# Patient Record
Sex: Male | Born: 1955 | Race: White | Hispanic: No | Smoking: Never smoker
Health system: Southern US, Community
[De-identification: ages and names within clinical notes are randomized; demographics above are authoritative.]

## PROBLEM LIST (undated history)

## (undated) DIAGNOSIS — F32A Depression, unspecified: Secondary | ICD-10-CM

## (undated) DIAGNOSIS — G839 Paralytic syndrome, unspecified: Secondary | ICD-10-CM

## (undated) DIAGNOSIS — F419 Anxiety disorder, unspecified: Secondary | ICD-10-CM

## (undated) DIAGNOSIS — M1990 Unspecified osteoarthritis, unspecified site: Secondary | ICD-10-CM

## (undated) DIAGNOSIS — K449 Diaphragmatic hernia without obstruction or gangrene: Secondary | ICD-10-CM

## (undated) DIAGNOSIS — I2699 Other pulmonary embolism without acute cor pulmonale: Secondary | ICD-10-CM

## (undated) DIAGNOSIS — K219 Gastro-esophageal reflux disease without esophagitis: Secondary | ICD-10-CM

## (undated) DIAGNOSIS — F329 Major depressive disorder, single episode, unspecified: Secondary | ICD-10-CM

## (undated) DIAGNOSIS — I82409 Acute embolism and thrombosis of unspecified deep veins of unspecified lower extremity: Secondary | ICD-10-CM

## (undated) DIAGNOSIS — IMO0002 Reserved for concepts with insufficient information to code with codable children: Secondary | ICD-10-CM

## (undated) DIAGNOSIS — G8254 Quadriplegia, C5-C7 incomplete: Secondary | ICD-10-CM

## (undated) DIAGNOSIS — N200 Calculus of kidney: Secondary | ICD-10-CM

## (undated) DIAGNOSIS — I499 Cardiac arrhythmia, unspecified: Secondary | ICD-10-CM

## (undated) DIAGNOSIS — I10 Essential (primary) hypertension: Secondary | ICD-10-CM

## (undated) HISTORY — PX: KNEE SURGERY: SHX244

## (undated) HISTORY — PX: PROSTATE SURGERY: SHX751

## (undated) HISTORY — PX: OTHER SURGICAL HISTORY: SHX169

---

## 1997-12-23 ENCOUNTER — Ambulatory Visit (HOSPITAL_COMMUNITY): Admission: RE | Admit: 1997-12-23 | Discharge: 1997-12-23 | Payer: Self-pay | Admitting: Urology

## 1998-11-26 ENCOUNTER — Encounter: Payer: Self-pay | Admitting: Urology

## 1998-11-26 ENCOUNTER — Encounter: Admission: RE | Admit: 1998-11-26 | Discharge: 1998-11-26 | Payer: Self-pay | Admitting: Urology

## 1999-01-27 ENCOUNTER — Encounter: Admission: RE | Admit: 1999-01-27 | Discharge: 1999-01-27 | Payer: Self-pay | Admitting: Urology

## 1999-01-27 ENCOUNTER — Encounter: Payer: Self-pay | Admitting: Urology

## 1999-02-19 ENCOUNTER — Encounter: Payer: Self-pay | Admitting: Urology

## 1999-02-19 ENCOUNTER — Encounter: Admission: RE | Admit: 1999-02-19 | Discharge: 1999-02-19 | Payer: Self-pay | Admitting: Urology

## 1999-04-01 ENCOUNTER — Encounter: Admission: RE | Admit: 1999-04-01 | Discharge: 1999-04-01 | Payer: Self-pay | Admitting: Urology

## 1999-04-01 ENCOUNTER — Encounter: Payer: Self-pay | Admitting: Urology

## 1999-04-20 ENCOUNTER — Ambulatory Visit (HOSPITAL_COMMUNITY): Admission: RE | Admit: 1999-04-20 | Discharge: 1999-04-20 | Payer: Self-pay | Admitting: Urology

## 1999-07-30 ENCOUNTER — Encounter: Admission: RE | Admit: 1999-07-30 | Discharge: 1999-07-30 | Payer: Self-pay | Admitting: Urology

## 1999-07-30 ENCOUNTER — Encounter: Payer: Self-pay | Admitting: Urology

## 1999-08-28 ENCOUNTER — Encounter: Admission: RE | Admit: 1999-08-28 | Discharge: 1999-08-28 | Payer: Self-pay | Admitting: Urology

## 1999-08-28 ENCOUNTER — Encounter: Payer: Self-pay | Admitting: Urology

## 1999-09-02 ENCOUNTER — Encounter: Payer: Self-pay | Admitting: Urology

## 1999-09-02 ENCOUNTER — Encounter: Admission: RE | Admit: 1999-09-02 | Discharge: 1999-09-02 | Payer: Self-pay | Admitting: Urology

## 2000-04-12 ENCOUNTER — Encounter: Payer: Self-pay | Admitting: Urology

## 2000-04-12 ENCOUNTER — Encounter: Admission: RE | Admit: 2000-04-12 | Discharge: 2000-04-12 | Payer: Self-pay | Admitting: Urology

## 2001-01-15 ENCOUNTER — Emergency Department (HOSPITAL_COMMUNITY): Admission: EM | Admit: 2001-01-15 | Discharge: 2001-01-15 | Payer: Self-pay | Admitting: Emergency Medicine

## 2001-01-15 ENCOUNTER — Encounter: Payer: Self-pay | Admitting: Emergency Medicine

## 2001-01-17 ENCOUNTER — Encounter: Admission: RE | Admit: 2001-01-17 | Discharge: 2001-01-17 | Payer: Self-pay | Admitting: Urology

## 2001-01-17 ENCOUNTER — Encounter: Payer: Self-pay | Admitting: Urology

## 2001-08-27 ENCOUNTER — Encounter: Payer: Self-pay | Admitting: Emergency Medicine

## 2001-08-27 ENCOUNTER — Emergency Department (HOSPITAL_COMMUNITY): Admission: EM | Admit: 2001-08-27 | Discharge: 2001-08-27 | Payer: Self-pay | Admitting: Emergency Medicine

## 2007-07-27 ENCOUNTER — Ambulatory Visit (HOSPITAL_BASED_OUTPATIENT_CLINIC_OR_DEPARTMENT_OTHER): Admission: RE | Admit: 2007-07-27 | Discharge: 2007-07-27 | Payer: Self-pay | Admitting: Orthopedic Surgery

## 2010-06-23 NOTE — Op Note (Signed)
NAMENORVEL, WENKER NO.:  [id number]   MEDICAL RECORD NO.:  [id number]          PATIENT TYPE:  AMB   LOCATION:  [facility]                         FACILITY:  [facility]   PHYSICIAN:  Deidre Ala, M.D.    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   POSTOPERATIVE DIAGNOSES:  1. Right knee thick medial and lateral plicae.  2. Degenerative lateral meniscus tear.  3. Tight lateral retinaculum.  4. 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   PROCEDURE:  1. Right knee operative arthroscopy with medial and lateral plica      excisions.  2. Partial lateral meniscectomy.  3. Lateral retinacular release.   SURGEON:  1. Charlesetta Shanks, M.D.   ASSISTANT:  Phineas Semen, P.A.   ANESTHESIA:  General with endotracheal.   CULTURES:  None.   DRAINS:  None.   ESTIMATED BLOOD LOSS:  Minimal.   TOURNIQUET TIME:  35 minutes.   PATHOLOGIC FINDINGS AND HISTORY:  Lucas Harper is a 55 year old man long  term patient of mine who has had increasing knee pain.  He was seen in  November of last year with probable patellofemoral pain, some quad  atrophy and we had given him some cortisone injection in his knees.  He  seemed to have more anterior type knee pain.  He was having trouble  bending his knee.  He got an MRI scan at the [place] which showed some  partial thickness cartilage loss involving the most posterior aspect of  the lateral femoral condyle with an area of cartilage loss measuring  approximately 7 mm.  He had minimal patellar tendinosis.  He in our  office had increasing difficulty flexing his knee past 120 degrees but  an antalgic gait, parapatellar discomfort.  He felt as if he wanted to  proceed with right knee arthroscopy.  He has shoulder impingement and  wants to have surgery  on that but wanted his knee taken care of first.  He had pain with ambulation with no erythema, palpation and mild  tenderness.  This was parapatellar.  At surgery today he had huge thick  medial and lateral plicae which I think were his most symptomatic site  and probably painful on knee flexion with patellar compression against  them.  They had underlying fibrous bands and there was some rubbing on  the medial femoral condyle secondary to them.  The trochlea looked good.  He had fissuring in the posterior patella with a tight lateral  retinaculum and lateral patellar tilt and track with a lateral release  carried out.  The ACL was intact.  The medial meniscus was intact.  I  did not see a defect in the far posterior portion of the lateral femoral  condyle but there was significant degenerative inner rim lateral  meniscus which we debrided to a stable rim and smoothed with the ablator  on one.  There was a  large lateral plica and synovitis in the gutter.   PROCEDURE IN DETAIL:  With adequate anesthesia obtained using LMA  technique, 1 gram Ancef given IV prophylaxis, the patient was placed in  the supine position.  The right lower extremity was prepped from the  malleoli to the leg holder in the standard fashion.  After standard  prepping and draping, Esmarch exsanguination was used.  The tourniquet  was let up to 50 mmHg.  Superior and lateral inflow portal was made.  The knee was insufflated with normal saline with arthroscopic pump.  Medial and lateral scope portals were then made and the joint was  thoroughly inspected.  I then shaved out the large plica back to the  sidewall and lysed the medial band.  I then cauterized and smoothed with  the ablator on one.  I then checked the medial meniscus with a probe,  smoothed the medial femoral condyle where the plica had rubbed and  checked the ACL.  Portals were reversed.  I then probed and looked at  the lateral meniscus, used basket and  shaver to saucerize the inner rim  and seal with the ablator on one, took out the lateral plica and  synovitis and then did an arthroscopic lateral retinacular release from  vastus lateralis to the joint line with good improved tilt and track and  balance pressurization resulting.  The knee was then irrigated through  the scope, 0.5% Marcaine with morphine was injected in and about the  portals.  The portals were left open.  A bulky sterile compressive  dressing was applied with lateral foam pad for tamponade and EZ wrap  placed.  The patient then having tolerated the procedure well was then  awakened and taken to the recovery room in satisfactory condition to be  discharged per outpatient routine and given Percocet for pain and told  to call the office for appointment for recheck tomorrow for dressing  change.           ______________________________  V. Charlesetta Shanks, M.D.     VEP/MEDQ  D:  07/27/2007  T:  07/27/2007  Job:  [id number]   cc:   Joana Reamer, Dr

## 2010-06-26 ENCOUNTER — Emergency Department (HOSPITAL_COMMUNITY): Payer: Medicare Other

## 2010-06-26 ENCOUNTER — Emergency Department (HOSPITAL_COMMUNITY)
Admission: EM | Admit: 2010-06-26 | Discharge: 2010-06-26 | Disposition: A | Discharge status: Home / Self Care | Payer: Medicare Other | Attending: Emergency Medicine | Admitting: Emergency Medicine

## 2010-06-26 DIAGNOSIS — S8010XA Contusion of unspecified lower leg, initial encounter: Secondary | ICD-10-CM | POA: Insufficient documentation

## 2010-06-26 DIAGNOSIS — Z87828 Personal history of other (healed) physical injury and trauma: Secondary | ICD-10-CM | POA: Insufficient documentation

## 2010-06-26 DIAGNOSIS — W010XXA Fall on same level from slipping, tripping and stumbling without subsequent striking against object, initial encounter: Secondary | ICD-10-CM | POA: Insufficient documentation

## 2010-06-26 DIAGNOSIS — G839 Paralytic syndrome, unspecified: Secondary | ICD-10-CM | POA: Insufficient documentation

## 2010-06-26 DIAGNOSIS — Y92009 Unspecified place in unspecified non-institutional (private) residence as the place of occurrence of the external cause: Secondary | ICD-10-CM | POA: Insufficient documentation

## 2010-06-26 DIAGNOSIS — I10 Essential (primary) hypertension: Secondary | ICD-10-CM | POA: Insufficient documentation

## 2010-11-05 LAB — POCT I-STAT 4, (NA,K, GLUC, HGB,HCT)
Glucose, Bld: 114 — ABNORMAL HIGH
HCT: 44
Hemoglobin: 15
Operator id: 114531
Potassium: 4
Sodium: 135

## 2010-11-24 ENCOUNTER — Emergency Department (HOSPITAL_COMMUNITY): Payer: Medicare Other

## 2010-11-24 ENCOUNTER — Encounter (HOSPITAL_COMMUNITY): Payer: Self-pay

## 2010-11-24 ENCOUNTER — Emergency Department (HOSPITAL_COMMUNITY)
Admission: EM | Admit: 2010-11-24 | Discharge: 2010-11-24 | Disposition: A | Discharge status: Home / Self Care | Payer: Medicare Other | Attending: Emergency Medicine | Admitting: Emergency Medicine

## 2010-11-24 DIAGNOSIS — R109 Unspecified abdominal pain: Secondary | ICD-10-CM | POA: Insufficient documentation

## 2010-11-24 DIAGNOSIS — I10 Essential (primary) hypertension: Secondary | ICD-10-CM | POA: Insufficient documentation

## 2010-11-24 DIAGNOSIS — Z79899 Other long term (current) drug therapy: Secondary | ICD-10-CM | POA: Insufficient documentation

## 2010-11-24 DIAGNOSIS — N201 Calculus of ureter: Secondary | ICD-10-CM | POA: Insufficient documentation

## 2010-11-24 DIAGNOSIS — Z87442 Personal history of urinary calculi: Secondary | ICD-10-CM | POA: Insufficient documentation

## 2010-11-24 DIAGNOSIS — K449 Diaphragmatic hernia without obstruction or gangrene: Secondary | ICD-10-CM | POA: Insufficient documentation

## 2010-11-24 DIAGNOSIS — G8320 Monoplegia of upper limb affecting unspecified side: Secondary | ICD-10-CM | POA: Insufficient documentation

## 2010-11-24 LAB — POCT I-STAT, CHEM 8
Chloride: 99 mEq/L (ref 96–112)
Creatinine, Ser: 1 mg/dL (ref 0.50–1.35)
Glucose, Bld: 132 mg/dL — ABNORMAL HIGH (ref 70–99)
HCT: 46 % (ref 39.0–52.0)
Potassium: 4.2 mEq/L (ref 3.5–5.1)

## 2010-11-24 LAB — URINALYSIS, ROUTINE W REFLEX MICROSCOPIC
Glucose, UA: NEGATIVE mg/dL
Leukocytes, UA: NEGATIVE
Nitrite: NEGATIVE
Protein, ur: NEGATIVE mg/dL
Specific Gravity, Urine: 1.023 (ref 1.005–1.030)
Urobilinogen, UA: 1 mg/dL (ref 0.0–1.0)
pH: 6 (ref 5.0–8.0)

## 2011-01-18 ENCOUNTER — Inpatient Hospital Stay (HOSPITAL_COMMUNITY)
Admission: EM | Admit: 2011-01-18 | Discharge: 2011-01-20 | DRG: 308 | Disposition: A | Discharge status: Home / Self Care | Payer: Non-veteran care | Source: Ambulatory Visit | Attending: Internal Medicine | Admitting: Internal Medicine

## 2011-01-18 ENCOUNTER — Emergency Department (HOSPITAL_COMMUNITY): Payer: Non-veteran care

## 2011-01-18 ENCOUNTER — Other Ambulatory Visit: Payer: Self-pay

## 2011-01-18 ENCOUNTER — Encounter (HOSPITAL_COMMUNITY): Payer: Self-pay | Admitting: Physical Medicine and Rehabilitation

## 2011-01-18 DIAGNOSIS — G8190 Hemiplegia, unspecified affecting unspecified side: Secondary | ICD-10-CM | POA: Diagnosis present

## 2011-01-18 DIAGNOSIS — I2699 Other pulmonary embolism without acute cor pulmonale: Secondary | ICD-10-CM

## 2011-01-18 DIAGNOSIS — Z86711 Personal history of pulmonary embolism: Secondary | ICD-10-CM | POA: Diagnosis present

## 2011-01-18 DIAGNOSIS — I4891 Unspecified atrial fibrillation: Principal | ICD-10-CM

## 2011-01-18 DIAGNOSIS — G8929 Other chronic pain: Secondary | ICD-10-CM | POA: Diagnosis present

## 2011-01-18 DIAGNOSIS — I10 Essential (primary) hypertension: Secondary | ICD-10-CM

## 2011-01-18 HISTORY — DX: Other pulmonary embolism without acute cor pulmonale: I26.99

## 2011-01-18 HISTORY — DX: Gastro-esophageal reflux disease without esophagitis: K21.9

## 2011-01-18 HISTORY — DX: Cardiac arrhythmia, unspecified: I49.9

## 2011-01-18 HISTORY — DX: Depression, unspecified: F32.A

## 2011-01-18 HISTORY — DX: Diaphragmatic hernia without obstruction or gangrene: K44.9

## 2011-01-18 HISTORY — DX: Anxiety disorder, unspecified: F41.9

## 2011-01-18 HISTORY — DX: Unspecified osteoarthritis, unspecified site: M19.90

## 2011-01-18 HISTORY — DX: Essential (primary) hypertension: I10

## 2011-01-18 HISTORY — DX: Major depressive disorder, single episode, unspecified: F32.9

## 2011-01-18 LAB — CBC
HCT: 39.3 % (ref 39.0–52.0)
Hemoglobin: 13.1 g/dL (ref 13.0–17.0)
MCH: 29.6 pg (ref 26.0–34.0)
MCHC: 33.3 g/dL (ref 30.0–36.0)
RBC: 4.43 MIL/uL (ref 4.22–5.81)

## 2011-01-18 LAB — CARDIAC PANEL(CRET KIN+CKTOT+MB+TROPI)
CK, MB: 6.3 ng/mL (ref 0.3–4.0)
CK, MB: 5.6 ng/mL — ABNORMAL HIGH (ref 0.3–4.0)
CK, MB: 5.4 ng/mL — ABNORMAL HIGH (ref 0.3–4.0)
Relative Index: 3.8 — ABNORMAL HIGH (ref 0.0–2.5)
Relative Index: 2.9 — ABNORMAL HIGH (ref 0.0–2.5)
Relative Index: 3.4 — ABNORMAL HIGH (ref 0.0–2.5)
Total CK: 194 U/L (ref 7–232)
Total CK: 165 U/L (ref 7–232)
Total CK: 153 U/L (ref 7–232)
Troponin I: <0.3 ng/mL (ref <0.30)

## 2011-01-18 LAB — DIFFERENTIAL
Basophils Relative: 0 % (ref 0–1)
Lymphs Abs: 1.9 10*3/uL (ref 0.7–4.0)
Monocytes Absolute: 0.5 10*3/uL (ref 0.1–1.0)
Monocytes Relative: 7 % (ref 3–12)
Neutro Abs: 4.5 10*3/uL (ref 1.7–7.7)

## 2011-01-18 LAB — POCT I-STAT, CHEM 8
Calcium, Ion: 1.1 mmol/L — ABNORMAL LOW (ref 1.12–1.32)
Chloride: 104 mEq/L (ref 96–112)
Creatinine, Ser: 0.8 mg/dL (ref 0.50–1.35)
Glucose, Bld: 108 mg/dL — ABNORMAL HIGH (ref 70–99)
HCT: 43 % (ref 39.0–52.0)

## 2011-01-18 MED ORDER — IOHEXOL 300 MG/ML  SOLN
100.0000 mL | Freq: Once | INTRAMUSCULAR | Status: AC | PRN
  Administered 2011-01-18: 100 mL via INTRAVENOUS

## 2011-01-18 MED ORDER — ENOXAPARIN SODIUM 100 MG/ML ~~LOC~~ SOLN
90.0000 mg | Freq: Two times a day (BID) | SUBCUTANEOUS | Status: DC
  Administered 2011-01-18 – 2011-01-19 (×4): 90 mg via SUBCUTANEOUS
  Filled 2011-01-18 (×7): qty 1

## 2011-01-18 MED ORDER — RIVAROXABAN 10 MG PO TABS: 10.0000 mg | ORAL_TABLET | Freq: Every day | ORAL | Status: DC

## 2011-01-18 MED ORDER — ONDANSETRON HCL 4 MG/2ML IJ SOLN: 4.0000 mg | Freq: Four times a day (QID) | INTRAMUSCULAR | Status: DC | PRN

## 2011-01-18 MED ORDER — WARFARIN VIDEO
Freq: Once | Status: AC
  Administered 2011-01-19: 23:00:00

## 2011-01-18 MED ORDER — BUPRENORPHINE HCL 2 MG SL SUBL
8.0000 mg | SUBLINGUAL_TABLET | Freq: Two times a day (BID) | SUBLINGUAL | Status: DC
  Administered 2011-01-18 – 2011-01-20 (×4): 8 mg via SUBLINGUAL
  Filled 2011-01-18 (×4): qty 4

## 2011-01-18 MED ORDER — SODIUM CHLORIDE 0.9 % IV SOLN
INTRAVENOUS | Status: DC
  Administered 2011-01-18 – 2011-01-19 (×4): via INTRAVENOUS

## 2011-01-18 MED ORDER — ACETAMINOPHEN 325 MG PO TABS: 650.0000 mg | ORAL_TABLET | Freq: Four times a day (QID) | ORAL | Status: DC | PRN

## 2011-01-18 MED ORDER — PANTOPRAZOLE SODIUM 40 MG PO TBEC
40.0000 mg | DELAYED_RELEASE_TABLET | Freq: Every day | ORAL | Status: DC
  Administered 2011-01-18 – 2011-01-20 (×3): 40 mg via ORAL
  Filled 2011-01-18 (×3): qty 1

## 2011-01-18 MED ORDER — VITAMIN D3 25 MCG (1000 UNIT) PO TABS
1000.0000 [IU] | ORAL_TABLET | Freq: Every day | ORAL | Status: DC
  Administered 2011-01-18 – 2011-01-20 (×3): 1000 [IU] via ORAL
  Filled 2011-01-18 (×4): qty 1

## 2011-01-18 MED ORDER — WARFARIN SODIUM 10 MG PO TABS
10.0000 mg | ORAL_TABLET | Freq: Once | ORAL | Status: AC
  Administered 2011-01-18: 10 mg via ORAL
  Filled 2011-01-18: qty 1

## 2011-01-18 MED ORDER — COUMADIN BOOK
Freq: Once | Status: AC
  Administered 2011-01-18: 18:00:00
  Filled 2011-01-18 (×2): qty 1

## 2011-01-18 MED ORDER — BUPRENORPHINE HCL-NALOXONE HCL 8-2 MG SL SUBL: 1.0000 | SUBLINGUAL_TABLET | Freq: Two times a day (BID) | SUBLINGUAL | Status: DC

## 2011-01-18 MED ORDER — ONDANSETRON HCL 4 MG PO TABS: 4.0000 mg | ORAL_TABLET | Freq: Four times a day (QID) | ORAL | Status: DC | PRN

## 2011-01-18 MED ORDER — METOPROLOL TARTRATE 1 MG/ML IV SOLN
INTRAVENOUS | Status: AC
  Filled 2011-01-18: qty 5

## 2011-01-18 MED ORDER — ACETAMINOPHEN 650 MG RE SUPP: 650.0000 mg | Freq: Four times a day (QID) | RECTAL | Status: DC | PRN
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
## 2011-01-18 NOTE — ED Notes (Signed)
Returned from [employer]

## 2011-01-18 NOTE — ED Notes (Signed)
Family at bedside. 
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
## 2011-01-18 NOTE — Progress Notes (Signed)
*  PRELIMINARY RESULTS* BLEV Duplex  has been performed.  No obvious evidence of deep vein thrombosis bilaterally. No evidence of Baker's Cyst bilaterally.    Mila Homer, RDMS 01/18/2011, 9:46 AM

## 2011-01-18 NOTE — Progress Notes (Signed)
Pt seen and examined, admitted by Dr. Toniann Fail today for acute PE. I have reviewed all imagings, labs and agree with current plan of care. - Will need to address lovenox teaching with his daughter, pt himself unable to do it. - F/U ECHO, dopplers US neg for DVT, cardiac enzymes.  RAI,RIPUDEEP 01/18/2011, 6:55 PM

## 2011-01-18 NOTE — Progress Notes (Signed)
Utilization Review Completed.  [name]  01/18/2011 
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
## 2011-01-18 NOTE — ED Notes (Signed)
Pt presents to department via [facility] for evaluation of chest pain. Per EMS pt was in afib on arrival at home. Pt states chest pain woke him from sleep. Pt also reports SOB. He is conscious alert and oriented x4. NSR upon arrival to ED. 20g LAC. 5mg  Lopressor per EMS. No signs of distress at the present.

## 2011-01-18 NOTE — ED Notes (Signed)
Danni Leabo (pt sister) would like to be contacted [phone] when the patient has been placed in a room

## 2011-01-19 LAB — BASIC METABOLIC PANEL
CO2: 27 mEq/L (ref 19–32)
Chloride: 103 mEq/L (ref 96–112)
Creatinine, Ser: 0.7 mg/dL (ref 0.50–1.35)
Glucose, Bld: 103 mg/dL — ABNORMAL HIGH (ref 70–99)
Sodium: 137 mEq/L (ref 135–145)

## 2011-01-19 LAB — CBC
HCT: 39.2 % (ref 39.0–52.0)
MCV: 89.9 fL (ref 78.0–100.0)
RBC: 4.36 MIL/uL (ref 4.22–5.81)
WBC: 8.3 10*3/uL (ref 4.0–10.5)

## 2011-01-19 LAB — CARDIAC PANEL(CRET KIN+CKTOT+MB+TROPI): Relative Index: 3.2 — ABNORMAL HIGH (ref 0.0–2.5)

## 2011-01-19 MED ORDER — WARFARIN SODIUM 2.5 MG PO TABS
12.5000 mg | ORAL_TABLET | Freq: Once | ORAL | Status: AC
  Administered 2011-01-19: 12.5 mg via ORAL
  Filled 2011-01-19: qty 1

## 2011-01-19 NOTE — Progress Notes (Signed)
  Echocardiogram 2D Echocardiogram has been performed.  Lucas Harper, Real Cons 01/19/2011, 11:43 AM

## 2011-01-19 NOTE — Progress Notes (Signed)
   CARE MANAGEMENT NOTE 01/19/2011  Patient:  ORA, MCNATT   Account Number:  [id number]  Date Initiated:  01/18/2011  Documentation initiated by:  Junius Creamer  Subjective/Objective Assessment:   adm w pul embolus     Action/Plan:   lives alone, pcp dr Bethena Roys   Anticipated DC Date:  01/20/2011   Anticipated DC Plan:  HOME/SELF CARE      DC Planning Services  CM consult      Choice offered to / List presented to:             Status of service:   Medicare Important Message given?   (If response is "NO", the following Medicare IM given date fields will be blank) Date Medicare IM given:   Date Additional Medicare IM given:    Discharge Disposition:    Per UR Regulation:  Reviewed for med. necessity/level of care/duration of stay  Comments:  12/11 have filled out prior approval form for [facility] supplemnt for lovenox and faxed in, also have call into dr Heide Guile burks at [employer] [facility] [employer] [phone] or her nse heather on red team to see if they can provide lovneox, await call back. debbie Jeorgia Helming rn,bsn 12/10 will get cm sec to ck to see if ins coverage for lovenox, debbie Kasyn Rolph rn,bsn [phone]
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
## 2011-01-19 NOTE — Progress Notes (Signed)
Lucas Harper  ZOX:[id number]  DOB: 25-Jun-1955  DOA: 01/18/2011  [facility]: [facility]  Subjective: No specific complaints, chest pain resolved  Objective: Weight change: -2.883 kg (-6 lb 5.7 oz)  Intake/Output Summary (Last 24 hours) at 01/19/11 1334 Last data filed at 01/19/11 1300  Gross per 24 hour  Intake 1678.75 ml  Output    400 ml  Net 1278.75 ml   Blood pressure 116/72, pulse 81, temperature 97.7 F (36.5 C), temperature source Oral, resp. rate 16, height 6' (1.829 m), weight 83.3 kg (183 lb 10.3 oz), SpO2 95.00%.  Physical Exam: General: Alert and awake, oriented x3, not in any acute distress. HEENT: anicteric sclera, pupils reactive to light and accommodation, EOMI CVS: S1-S2 clear, no murmur rubs or gallops Chest: clear to auscultation bilaterally, no wheezing, rales or rhonchi Abdomen: soft nontender, nondistended, normal bowel sounds, no organomegaly Extremities: no cyanosis, clubbing or edema noted bilaterally   Lab Results: Basic Metabolic Panel:  Lab 01/19/11 8119 01/18/11 0334  NA 137 141  K 3.9 3.9  CL 103 104  CO2 27 --  GLUCOSE 103* 108*  BUN 11 14  CREATININE 0.70 0.80  CALCIUM 9.1 --  MG -- --  PHOS -- --   CBC:  Lab 01/19/11 0537 01/18/11 0334 01/18/11 0320  WBC 8.3 -- 7.0  NEUTROABS -- -- 4.5  HGB 13.4 14.6 --  HCT 39.2 43.0 --  MCV 89.9 -- 88.7  PLT 213 -- 217   Cardiac Enzymes:  Lab 01/19/11 0537 01/18/11 2030 01/18/11 1852  CKTOTAL 131 153 165  CKMB 4.2* 5.4* 5.6*  CKMBINDEX -- -- --  TROPONINI <0.30 <0.30 <0.30    Studies/Results: Ct Angio Chest W/cm &/or Wo Cm  01/18/2011  *RADIOLOGY REPORT*  Clinical Data:  Chest pain and shortness of breath.  CT ANGIOGRAPHY CHEST WITH CONTRAST  IMPRESSION: Small focal peripheral emboli demonstrated and left lower lung pulmonary arteries.  Atelectasis in the posterior lungs, likely to be due to dependent changes.  Original Report Authenticated By: Marlon Pel, M.D.   Dg Chest  Port 1 View  01/18/2011  *RADIOLOGY REPORT*  Clinical Data: Chest pain  PORTABLE CHEST - 1 VIEW  Comparison: None.  Findings: Shallow inspiration. The heart size and pulmonary vascularity are normal. The lungs appear clear and expanded without focal air space disease or consolidation. No blunting of the costophrenic angles.  IMPRESSION: Shallow inspiration.  No evidence of active pulmonary disease.  Original Report Authenticated By: Marlon Pel, M.D.    Medications: Scheduled Meds:   . buprenorphine  8 mg Sublingual BID  . cholecalciferol  1,000 Units Oral Daily  . coumadin book   Does not apply Once  . enoxaparin (LOVENOX) injection  90 mg Subcutaneous Q12H  . pantoprazole  40 mg Oral Q1200  . warfarin  10 mg Oral ONCE-1800  . warfarin  12.5 mg Oral ONCE-1800  . warfarin   Does not apply Once  . DISCONTD: buprenorphine-naloxone  1 tablet Sublingual BID   Continuous Infusions:   . sodium chloride 75 mL/hr at 01/19/11 0505     Assessment/Plan: Principal Problem:  *Pulmonary embolism:  - Doppler ultrasounds bilaterally done no evidence of DVT -  Continue Lovenox and Coumadin, 2-D echo done results pending - Patient states he is unable to do Lovenox injections himself, will have to provide teaching to his daughter - Discussed with case manager to check with the [facility] for Lovenox authorization  Active Problems:  A-fib: Rate controlled on  Lovenox and Coumadin  HTN (hypertension): Stable  Disposition: Hopefully DC home tomorrow,  if Lovenox authorized and teaching done with patient's daughter.    LOS: 1 day   Eriona Kinchen 01/19/2011, 1:34 PM

## 2011-01-19 NOTE — Progress Notes (Signed)
Pt had req that i check w dr Salli Real at [facility] about lovenox. Heather dr Salli Real nse called back and req h&p, docum about pul emb so she can speak w md to see if they can assist w lovenox inject. [facility] Await their decision.

## 2011-01-20 ENCOUNTER — Encounter (HOSPITAL_COMMUNITY): Payer: Self-pay | Admitting: Physical Therapy

## 2011-01-20 LAB — CBC
HCT: 40.2 % (ref 39.0–52.0)
MCV: 89.5 fL (ref 78.0–100.0)
RBC: 4.49 MIL/uL (ref 4.22–5.81)
WBC: 9 10*3/uL (ref 4.0–10.5)

## 2011-01-20 MED ORDER — RIVAROXABAN 15 MG PO TABS: 15.0000 mg | ORAL_TABLET | Freq: Two times a day (BID) | ORAL | Status: DC

## 2011-01-20 MED ORDER — RIVAROXABAN 15 MG PO TABS
15.0000 mg | ORAL_TABLET | Freq: Two times a day (BID) | ORAL | Status: DC
  Administered 2011-01-20 (×2): 15 mg via ORAL
  Filled 2011-01-20 (×5): qty 1

## 2011-01-20 NOTE — Progress Notes (Signed)
INITIAL ADULT NUTRITION ASSESSMENT Date: 01/20/2011   Time: 2:14 PM  Reason for Assessment: Low Braden  ASSESSMENT: Male 55 y.o.  Dx: Pulmonary embolism  Hx:  Past Medical History  Diagnosis Date  . Hypertension   . Angina   . Shortness of breath   . Chronic kidney disease     STONES  . GERD (gastroesophageal reflux disease)   . Arthritis   . Anxiety   . Dysrhythmia   . Peripheral vascular disease   . Hiatal hernia   . Depression     Related Meds:     . buprenorphine  8 mg Sublingual BID  . cholecalciferol  1,000 Units Oral Daily  . pantoprazole  40 mg Oral Q1200  . rivaroxaban  15 mg Oral BID PC  . warfarin  12.5 mg Oral ONCE-1800  . warfarin   Does not apply Once  . DISCONTD: enoxaparin (LOVENOX) injection  90 mg Subcutaneous Q12H    Ht: 6' (182.9 cm)  Wt: 183 lb 10.3 oz (83.3 kg)  Ideal Wt: 81 kg % Ideal Wt: 97%  Usual Wt: --- % Usual Wt: ---  Body mass index is 24.91 kg/(m^2).  Food/Nutrition Related Hx: no nutrition problems per nutrition screen  Labs:  CMP     Component Value Date/Time   NA 137 01/19/2011 0537   K 3.9 01/19/2011 0537   CL 103 01/19/2011 0537   CO2 27 01/19/2011 0537   GLUCOSE 103* 01/19/2011 0537   BUN 11 01/19/2011 0537   CREATININE 0.70 01/19/2011 0537   CALCIUM 9.1 01/19/2011 0537   GFRNONAA >90 01/19/2011 0537   GFRAA >90 01/19/2011 0537    I/O last 3 completed shifts: In: 3403.8 [I.V.:3403.8] Out: 400 [Urine:400]     Diet Order: Heart Healthy  Supplements/Tube Feeding: N/A  IVF:    DISCONTD: sodium chloride Last Rate: 75 mL/hr at 01/19/11 1832    Estimated Nutritional Needs:   Kcal: 2,100-2,300 Protein: 105-115 gm Fluid: 2.1-2.3 L  RD spoke with pt re: nutrition hx -- states his appetite is "ok"; no reported weight loss recently; no PO intake documented flowsheet records; noted pt with Stage II pressure ulcer; pt at risk for further skin breakdown give low braden score -- pt declining addition of  supplementation at this time.  NUTRITION DIAGNOSIS: -Increased nutrient needs (NI-5.1).  Status: Ongoing  RELATED TO: wound healing  AS EVIDENCE BY: estimated nutrition needs  MONITORING/EVALUATION(Goals): Goal: Meet 90-100% of estimated nutrition needs to promote wound healing Monitor: PO intake, weight, labs, I/O's, wound care  EDUCATION NEEDS: -No education needs identified at this time  INTERVENTION:  No intervention at this time -- pt declined  RD to follow for nutrition care plan  Dietitian #: [phone]  DOCUMENTATION CODES Per approved criteria  -Not Applicable    Alger Memos 01/20/2011, 2:14 PM

## 2011-01-20 NOTE — Progress Notes (Signed)
   CARE MANAGEMENT NOTE 01/20/2011  Patient:  Lucas Harper, Lucas Harper   Account Number:  [id number]  Date Initiated:  01/18/2011  Documentation initiated by:  Junius Creamer  Subjective/Objective Assessment:   adm w pul embolus     Action/Plan:   lives alone, pcp dr Bethena Roys   Anticipated DC Date:  01/20/2011   Anticipated DC Plan:  HOME/SELF CARE      DC Planning Services  CM consult      [facility] Choice  HOME HEALTH   Choice offered to / List presented to:  C-1 Patient        HH arranged  HH-1 RN  HH-2 PT      HH agency  OTHER - SEE NOTE   Status of service:   Medicare Important Message given?   (If response is "NO", the following Medicare IM given date fields will be blank) Date Medicare IM given:   Date Additional Medicare IM given:    Discharge Disposition:  HOME W HOME HEALTH SERVICES  Per UR Regulation:  Reviewed for med. necessity/level of care/duration of stay  Comments:  12/12 SPOKE W PT, MD SWITCHING PT TO Lesia Hausen INSTEAD OF COUMADIN AND LOVENOX. gave pt 10day free med and pt states he will use [facility] for rest of med, spoke w heather nse w dr Salli Real at [place] and they do not have xaralto. heather will arrange [facility] thru [facility]. she will set up for pt. debbie Nomie Buchberger rn,bsn 12/11 have filled out prior approval form for [facility] supplemnt for lovenox and faxed in, also have call into dr Heide Guile burks at [facility] [phone] or her nse heather on red team to see if they can provide lovneox, await call back. debbie Envi Eagleson rn,bsn 12/10 will get cm sec to ck to see if ins coverage for lovenox, debbie Elowen Debruyn rn,bsn [phone]
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
## 2011-01-20 NOTE — Progress Notes (Signed)
Pt has xarelto card. He has prescription for 10days free and hard copy if has to buy addit from [employer] card. Faxed prescription for xarelto to tasha moose at [facility] and they are going to try and get and mail xarelto to pt. Heather dr Salli Real nse w [facility] to [facility] and [facility]. Also req that healther add home health occup there.
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
## 2011-01-21 NOTE — Progress Notes (Signed)
Late entry: Lucas Harper dr Salli Real office called and [facility]. Lucas asked cm to call [facility] and req pt to eval for ot at home so i called [facility] this am and req home pt eval for home o2 needs.

## 2011-03-08 ENCOUNTER — Other Ambulatory Visit: Payer: Self-pay | Admitting: Internal Medicine

## 2011-03-22 DIAGNOSIS — I2699 Other pulmonary embolism without acute cor pulmonale: Secondary | ICD-10-CM | POA: Diagnosis not present

## 2011-03-22 DIAGNOSIS — I10 Essential (primary) hypertension: Secondary | ICD-10-CM | POA: Diagnosis not present

## 2011-03-22 DIAGNOSIS — E291 Testicular hypofunction: Secondary | ICD-10-CM | POA: Diagnosis not present

## 2011-03-22 DIAGNOSIS — M25559 Pain in unspecified hip: Secondary | ICD-10-CM | POA: Diagnosis not present

## 2011-03-22 DIAGNOSIS — K219 Gastro-esophageal reflux disease without esophagitis: Secondary | ICD-10-CM | POA: Diagnosis not present

## 2011-04-10 ENCOUNTER — Other Ambulatory Visit: Payer: Self-pay | Admitting: Internal Medicine

## 2011-05-11 DIAGNOSIS — I4891 Unspecified atrial fibrillation: Secondary | ICD-10-CM | POA: Diagnosis not present

## 2011-05-11 DIAGNOSIS — I2699 Other pulmonary embolism without acute cor pulmonale: Secondary | ICD-10-CM | POA: Diagnosis not present

## 2011-06-29 DIAGNOSIS — E291 Testicular hypofunction: Secondary | ICD-10-CM | POA: Diagnosis not present

## 2011-06-29 DIAGNOSIS — M25559 Pain in unspecified hip: Secondary | ICD-10-CM | POA: Diagnosis not present

## 2011-06-29 DIAGNOSIS — K219 Gastro-esophageal reflux disease without esophagitis: Secondary | ICD-10-CM | POA: Diagnosis not present

## 2011-06-29 DIAGNOSIS — I10 Essential (primary) hypertension: Secondary | ICD-10-CM | POA: Diagnosis not present

## 2011-06-29 DIAGNOSIS — Z6825 Body mass index (BMI) 25.0-25.9, adult: Secondary | ICD-10-CM | POA: Diagnosis not present

## 2011-07-20 DIAGNOSIS — G959 Disease of spinal cord, unspecified: Secondary | ICD-10-CM | POA: Diagnosis not present

## 2011-07-20 DIAGNOSIS — I10 Essential (primary) hypertension: Secondary | ICD-10-CM | POA: Diagnosis not present

## 2011-07-20 DIAGNOSIS — N200 Calculus of kidney: Secondary | ICD-10-CM | POA: Diagnosis not present

## 2011-09-01 DIAGNOSIS — E7800 Pure hypercholesterolemia, unspecified: Secondary | ICD-10-CM | POA: Diagnosis not present

## 2011-09-01 DIAGNOSIS — K219 Gastro-esophageal reflux disease without esophagitis: Secondary | ICD-10-CM | POA: Diagnosis not present

## 2011-09-01 DIAGNOSIS — E291 Testicular hypofunction: Secondary | ICD-10-CM | POA: Diagnosis not present

## 2011-09-01 DIAGNOSIS — M25559 Pain in unspecified hip: Secondary | ICD-10-CM | POA: Diagnosis not present

## 2011-09-01 DIAGNOSIS — I10 Essential (primary) hypertension: Secondary | ICD-10-CM | POA: Diagnosis not present

## 2011-10-03 ENCOUNTER — Encounter (HOSPITAL_COMMUNITY): Payer: Self-pay

## 2011-10-03 ENCOUNTER — Emergency Department (HOSPITAL_COMMUNITY)
Admission: EM | Admit: 2011-10-03 | Discharge: 2011-10-04 | Disposition: A | Discharge status: Home / Self Care | Payer: Medicare Other | Attending: Emergency Medicine | Admitting: Emergency Medicine

## 2011-10-03 DIAGNOSIS — N189 Chronic kidney disease, unspecified: Secondary | ICD-10-CM | POA: Insufficient documentation

## 2011-10-03 DIAGNOSIS — M79609 Pain in unspecified limb: Secondary | ICD-10-CM | POA: Diagnosis not present

## 2011-10-03 DIAGNOSIS — M79606 Pain in leg, unspecified: Secondary | ICD-10-CM

## 2011-10-03 DIAGNOSIS — I129 Hypertensive chronic kidney disease with stage 1 through stage 4 chronic kidney disease, or unspecified chronic kidney disease: Secondary | ICD-10-CM | POA: Insufficient documentation

## 2011-10-03 DIAGNOSIS — M129 Arthropathy, unspecified: Secondary | ICD-10-CM | POA: Insufficient documentation

## 2011-10-03 DIAGNOSIS — R6889 Other general symptoms and signs: Secondary | ICD-10-CM | POA: Diagnosis not present

## 2011-10-03 DIAGNOSIS — K219 Gastro-esophageal reflux disease without esophagitis: Secondary | ICD-10-CM | POA: Insufficient documentation
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
## 2011-10-19 DIAGNOSIS — K449 Diaphragmatic hernia without obstruction or gangrene: Secondary | ICD-10-CM | POA: Diagnosis not present

## 2011-11-02 DIAGNOSIS — E291 Testicular hypofunction: Secondary | ICD-10-CM | POA: Diagnosis not present

## 2011-11-02 DIAGNOSIS — L0291 Cutaneous abscess, unspecified: Secondary | ICD-10-CM | POA: Diagnosis not present

## 2011-11-02 DIAGNOSIS — K219 Gastro-esophageal reflux disease without esophagitis: Secondary | ICD-10-CM | POA: Diagnosis not present

## 2011-11-02 DIAGNOSIS — L0390 Cellulitis, unspecified: Secondary | ICD-10-CM | POA: Diagnosis not present

## 2011-11-02 DIAGNOSIS — M25559 Pain in unspecified hip: Secondary | ICD-10-CM | POA: Diagnosis not present

## 2011-11-02 DIAGNOSIS — I10 Essential (primary) hypertension: Secondary | ICD-10-CM | POA: Diagnosis not present

## 2012-01-20 DIAGNOSIS — K219 Gastro-esophageal reflux disease without esophagitis: Secondary | ICD-10-CM | POA: Diagnosis not present

## 2012-01-20 DIAGNOSIS — E291 Testicular hypofunction: Secondary | ICD-10-CM | POA: Diagnosis not present

## 2012-01-20 DIAGNOSIS — Z6825 Body mass index (BMI) 25.0-25.9, adult: Secondary | ICD-10-CM | POA: Diagnosis not present

## 2012-01-20 DIAGNOSIS — I10 Essential (primary) hypertension: Secondary | ICD-10-CM | POA: Diagnosis not present

## 2012-01-20 DIAGNOSIS — M25559 Pain in unspecified hip: Secondary | ICD-10-CM | POA: Diagnosis not present

## 2012-04-04 DIAGNOSIS — Z6825 Body mass index (BMI) 25.0-25.9, adult: Secondary | ICD-10-CM | POA: Diagnosis not present

## 2012-04-04 DIAGNOSIS — I10 Essential (primary) hypertension: Secondary | ICD-10-CM | POA: Diagnosis not present

## 2012-04-04 DIAGNOSIS — K219 Gastro-esophageal reflux disease without esophagitis: Secondary | ICD-10-CM | POA: Diagnosis not present

## 2012-04-04 DIAGNOSIS — M25559 Pain in unspecified hip: Secondary | ICD-10-CM | POA: Diagnosis not present

## 2012-04-04 DIAGNOSIS — E291 Testicular hypofunction: Secondary | ICD-10-CM | POA: Diagnosis not present

## 2012-04-26 DIAGNOSIS — M5430 Sciatica, unspecified side: Secondary | ICD-10-CM | POA: Diagnosis not present

## 2012-04-26 DIAGNOSIS — G8190 Hemiplegia, unspecified affecting unspecified side: Secondary | ICD-10-CM | POA: Diagnosis not present

## 2012-04-26 DIAGNOSIS — I4891 Unspecified atrial fibrillation: Secondary | ICD-10-CM | POA: Diagnosis not present

## 2012-06-05 DIAGNOSIS — Z6825 Body mass index (BMI) 25.0-25.9, adult: Secondary | ICD-10-CM | POA: Diagnosis not present

## 2012-06-05 DIAGNOSIS — K219 Gastro-esophageal reflux disease without esophagitis: Secondary | ICD-10-CM | POA: Diagnosis not present

## 2012-06-05 DIAGNOSIS — M25559 Pain in unspecified hip: Secondary | ICD-10-CM | POA: Diagnosis not present

## 2012-06-05 DIAGNOSIS — I10 Essential (primary) hypertension: Secondary | ICD-10-CM | POA: Diagnosis not present

## 2012-06-05 DIAGNOSIS — E291 Testicular hypofunction: Secondary | ICD-10-CM | POA: Diagnosis not present

## 2012-08-07 DIAGNOSIS — M25559 Pain in unspecified hip: Secondary | ICD-10-CM | POA: Diagnosis not present

## 2012-08-07 DIAGNOSIS — B372 Candidiasis of skin and nail: Secondary | ICD-10-CM | POA: Diagnosis not present

## 2012-08-07 DIAGNOSIS — Z6825 Body mass index (BMI) 25.0-25.9, adult: Secondary | ICD-10-CM | POA: Diagnosis not present

## 2012-08-07 DIAGNOSIS — E291 Testicular hypofunction: Secondary | ICD-10-CM | POA: Diagnosis not present

## 2012-08-07 DIAGNOSIS — I10 Essential (primary) hypertension: Secondary | ICD-10-CM | POA: Diagnosis not present

## 2012-09-18 DIAGNOSIS — G959 Disease of spinal cord, unspecified: Secondary | ICD-10-CM | POA: Diagnosis not present

## 2012-09-18 DIAGNOSIS — N200 Calculus of kidney: Secondary | ICD-10-CM | POA: Diagnosis not present

## 2012-09-18 DIAGNOSIS — F4310 Post-traumatic stress disorder, unspecified: Secondary | ICD-10-CM | POA: Diagnosis not present

## 2012-09-18 DIAGNOSIS — I10 Essential (primary) hypertension: Secondary | ICD-10-CM | POA: Diagnosis not present

## 2012-10-04 DIAGNOSIS — M25559 Pain in unspecified hip: Secondary | ICD-10-CM | POA: Diagnosis not present

## 2012-10-04 DIAGNOSIS — E7800 Pure hypercholesterolemia, unspecified: Secondary | ICD-10-CM | POA: Diagnosis not present

## 2012-10-04 DIAGNOSIS — I10 Essential (primary) hypertension: Secondary | ICD-10-CM | POA: Diagnosis not present

## 2012-10-04 DIAGNOSIS — E291 Testicular hypofunction: Secondary | ICD-10-CM | POA: Diagnosis not present

## 2012-10-04 DIAGNOSIS — K219 Gastro-esophageal reflux disease without esophagitis: Secondary | ICD-10-CM | POA: Diagnosis not present

## 2012-11-30 DIAGNOSIS — R1310 Dysphagia, unspecified: Secondary | ICD-10-CM | POA: Diagnosis not present

## 2012-12-04 DIAGNOSIS — Z6825 Body mass index (BMI) 25.0-25.9, adult: Secondary | ICD-10-CM | POA: Diagnosis not present

## 2012-12-04 DIAGNOSIS — E291 Testicular hypofunction: Secondary | ICD-10-CM | POA: Diagnosis not present

## 2012-12-04 DIAGNOSIS — M25559 Pain in unspecified hip: Secondary | ICD-10-CM | POA: Diagnosis not present

## 2012-12-04 DIAGNOSIS — I10 Essential (primary) hypertension: Secondary | ICD-10-CM | POA: Diagnosis not present

## 2012-12-04 DIAGNOSIS — K219 Gastro-esophageal reflux disease without esophagitis: Secondary | ICD-10-CM | POA: Diagnosis not present

## 2012-12-20 DIAGNOSIS — I10 Essential (primary) hypertension: Secondary | ICD-10-CM | POA: Diagnosis not present

## 2012-12-20 DIAGNOSIS — Z801 Family history of malignant neoplasm of trachea, bronchus and lung: Secondary | ICD-10-CM | POA: Diagnosis not present

## 2012-12-20 DIAGNOSIS — Z862 Personal history of diseases of the blood and blood-forming organs and certain disorders involving the immune mechanism: Secondary | ICD-10-CM | POA: Diagnosis not present

## 2012-12-20 DIAGNOSIS — Z87442 Personal history of urinary calculi: Secondary | ICD-10-CM | POA: Diagnosis not present

## 2012-12-20 DIAGNOSIS — R1310 Dysphagia, unspecified: Secondary | ICD-10-CM | POA: Diagnosis not present

## 2012-12-20 DIAGNOSIS — K449 Diaphragmatic hernia without obstruction or gangrene: Secondary | ICD-10-CM | POA: Diagnosis not present

## 2012-12-20 DIAGNOSIS — K219 Gastro-esophageal reflux disease without esophagitis: Secondary | ICD-10-CM | POA: Diagnosis not present

## 2012-12-20 DIAGNOSIS — G8190 Hemiplegia, unspecified affecting unspecified side: Secondary | ICD-10-CM | POA: Diagnosis not present

## 2012-12-20 DIAGNOSIS — Z823 Family history of stroke: Secondary | ICD-10-CM | POA: Diagnosis not present

## 2013-01-10 DIAGNOSIS — D689 Coagulation defect, unspecified: Secondary | ICD-10-CM | POA: Diagnosis not present

## 2013-01-10 DIAGNOSIS — I4891 Unspecified atrial fibrillation: Secondary | ICD-10-CM | POA: Diagnosis not present

## 2013-01-17 DIAGNOSIS — I2699 Other pulmonary embolism without acute cor pulmonale: Secondary | ICD-10-CM | POA: Diagnosis not present

## 2013-01-17 DIAGNOSIS — I4891 Unspecified atrial fibrillation: Secondary | ICD-10-CM | POA: Diagnosis not present

## 2013-02-09 DIAGNOSIS — I10 Essential (primary) hypertension: Secondary | ICD-10-CM | POA: Diagnosis not present

## 2013-02-09 DIAGNOSIS — K219 Gastro-esophageal reflux disease without esophagitis: Secondary | ICD-10-CM | POA: Diagnosis not present

## 2013-02-09 DIAGNOSIS — M25559 Pain in unspecified hip: Secondary | ICD-10-CM | POA: Diagnosis not present

## 2013-02-09 DIAGNOSIS — E291 Testicular hypofunction: Secondary | ICD-10-CM | POA: Diagnosis not present

## 2013-02-09 DIAGNOSIS — E7800 Pure hypercholesterolemia, unspecified: Secondary | ICD-10-CM | POA: Diagnosis not present

## 2013-03-28 ENCOUNTER — Emergency Department (HOSPITAL_COMMUNITY): Payer: Non-veteran care

## 2013-03-28 ENCOUNTER — Inpatient Hospital Stay (HOSPITAL_COMMUNITY)
Admission: EM | Admit: 2013-03-28 | Discharge: 2013-04-04 | DRG: 481 | Disposition: A | Discharge status: Skilled Nursing Facility | Payer: Non-veteran care | Attending: Orthopedic Surgery | Admitting: Orthopedic Surgery

## 2013-03-28 ENCOUNTER — Encounter (HOSPITAL_COMMUNITY): Payer: Self-pay | Admitting: Emergency Medicine

## 2013-03-28 DIAGNOSIS — F411 Generalized anxiety disorder: Secondary | ICD-10-CM | POA: Diagnosis present

## 2013-03-28 DIAGNOSIS — I10 Essential (primary) hypertension: Secondary | ICD-10-CM | POA: Diagnosis not present

## 2013-03-28 DIAGNOSIS — Z9181 History of falling: Secondary | ICD-10-CM

## 2013-03-28 DIAGNOSIS — M810 Age-related osteoporosis without current pathological fracture: Secondary | ICD-10-CM | POA: Diagnosis present

## 2013-03-28 DIAGNOSIS — S72409A Unspecified fracture of lower end of unspecified femur, initial encounter for closed fracture: Principal | ICD-10-CM | POA: Diagnosis present

## 2013-03-28 DIAGNOSIS — F329 Major depressive disorder, single episode, unspecified: Secondary | ICD-10-CM | POA: Diagnosis present

## 2013-03-28 DIAGNOSIS — M79609 Pain in unspecified limb: Secondary | ICD-10-CM | POA: Diagnosis present

## 2013-03-28 DIAGNOSIS — G8191 Hemiplegia, unspecified affecting right dominant side: Secondary | ICD-10-CM

## 2013-03-28 DIAGNOSIS — M25569 Pain in unspecified knee: Secondary | ICD-10-CM | POA: Diagnosis not present

## 2013-03-28 DIAGNOSIS — R52 Pain, unspecified: Secondary | ICD-10-CM | POA: Diagnosis not present

## 2013-03-28 DIAGNOSIS — M25559 Pain in unspecified hip: Secondary | ICD-10-CM | POA: Diagnosis not present

## 2013-03-28 DIAGNOSIS — Z86711 Personal history of pulmonary embolism: Secondary | ICD-10-CM | POA: Diagnosis present

## 2013-03-28 DIAGNOSIS — IMO0002 Reserved for concepts with insufficient information to code with codable children: Secondary | ICD-10-CM | POA: Diagnosis present

## 2013-03-28 DIAGNOSIS — S298XXA Other specified injuries of thorax, initial encounter: Secondary | ICD-10-CM | POA: Diagnosis not present

## 2013-03-28 DIAGNOSIS — D62 Acute posthemorrhagic anemia: Secondary | ICD-10-CM | POA: Diagnosis not present

## 2013-03-28 DIAGNOSIS — Y92009 Unspecified place in unspecified non-institutional (private) residence as the place of occurrence of the external cause: Secondary | ICD-10-CM

## 2013-03-28 DIAGNOSIS — I2782 Chronic pulmonary embolism: Secondary | ICD-10-CM | POA: Diagnosis present

## 2013-03-28 DIAGNOSIS — K219 Gastro-esophageal reflux disease without esophagitis: Secondary | ICD-10-CM | POA: Diagnosis present

## 2013-03-28 DIAGNOSIS — S79919A Unspecified injury of unspecified hip, initial encounter: Secondary | ICD-10-CM | POA: Diagnosis not present

## 2013-03-28 DIAGNOSIS — R079 Chest pain, unspecified: Secondary | ICD-10-CM | POA: Diagnosis not present

## 2013-03-28 DIAGNOSIS — W010XXA Fall on same level from slipping, tripping and stumbling without subsequent striking against object, initial encounter: Secondary | ICD-10-CM | POA: Diagnosis present

## 2013-03-28 DIAGNOSIS — G839 Paralytic syndrome, unspecified: Secondary | ICD-10-CM

## 2013-03-28 DIAGNOSIS — Z823 Family history of stroke: Secondary | ICD-10-CM

## 2013-03-28 DIAGNOSIS — Z9119 Patient's noncompliance with other medical treatment and regimen: Secondary | ICD-10-CM

## 2013-03-28 DIAGNOSIS — F1920 Other psychoactive substance dependence, uncomplicated: Secondary | ICD-10-CM | POA: Diagnosis present

## 2013-03-28 DIAGNOSIS — R1310 Dysphagia, unspecified: Secondary | ICD-10-CM | POA: Diagnosis present

## 2013-03-28 DIAGNOSIS — S7291XA Unspecified fracture of right femur, initial encounter for closed fracture: Secondary | ICD-10-CM

## 2013-03-28 DIAGNOSIS — G8190 Hemiplegia, unspecified affecting unspecified side: Secondary | ICD-10-CM | POA: Diagnosis present

## 2013-03-28 DIAGNOSIS — F3289 Other specified depressive episodes: Secondary | ICD-10-CM | POA: Diagnosis present

## 2013-03-28 DIAGNOSIS — I959 Hypotension, unspecified: Secondary | ICD-10-CM | POA: Diagnosis present

## 2013-03-28 DIAGNOSIS — S7290XA Unspecified fracture of unspecified femur, initial encounter for closed fracture: Secondary | ICD-10-CM | POA: Diagnosis not present

## 2013-03-28 DIAGNOSIS — G8929 Other chronic pain: Secondary | ICD-10-CM

## 2013-03-28 DIAGNOSIS — W19XXXA Unspecified fall, initial encounter: Secondary | ICD-10-CM

## 2013-03-28 DIAGNOSIS — I498 Other specified cardiac arrhythmias: Secondary | ICD-10-CM | POA: Diagnosis present

## 2013-03-28 DIAGNOSIS — F449 Dissociative and conversion disorder, unspecified: Secondary | ICD-10-CM | POA: Diagnosis present

## 2013-03-28 DIAGNOSIS — R259 Unspecified abnormal involuntary movements: Secondary | ICD-10-CM | POA: Diagnosis present

## 2013-03-28 DIAGNOSIS — K449 Diaphragmatic hernia without obstruction or gangrene: Secondary | ICD-10-CM | POA: Diagnosis present

## 2013-03-28 DIAGNOSIS — Z7901 Long term (current) use of anticoagulants: Secondary | ICD-10-CM

## 2013-03-28 DIAGNOSIS — R339 Retention of urine, unspecified: Secondary | ICD-10-CM | POA: Diagnosis present

## 2013-03-28 DIAGNOSIS — Z91199 Patient's noncompliance with other medical treatment and regimen due to unspecified reason: Secondary | ICD-10-CM

## 2013-03-28 DIAGNOSIS — I4891 Unspecified atrial fibrillation: Secondary | ICD-10-CM | POA: Diagnosis present

## 2013-03-28 DIAGNOSIS — Z801 Family history of malignant neoplasm of trachea, bronchus and lung: Secondary | ICD-10-CM

## 2013-03-28 HISTORY — DX: Paralytic syndrome, unspecified: G83.9

## 2013-03-28 MED ORDER — HYDROMORPHONE HCL PF 1 MG/ML IJ SOLN
1.0000 mg | Freq: Once | INTRAMUSCULAR | Status: AC
  Administered 2013-03-29: 1 mg via INTRAVENOUS
  Filled 2013-03-28: qty 1
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
## 2013-03-28 NOTE — ED Notes (Signed)
Per ems_ the patient reports that he fell tonight after loosing his balance. Patient has balance issues r/t to paralaysis in the past. Patient had Fentanyl 100 mcg - en route

## 2013-03-28 NOTE — ED Notes (Signed)
Bed: [id number] Expected date:  Expected time:  Means of arrival:  Comments: EMS/58 yo male with fall-knee injury and pain

## 2013-03-29 ENCOUNTER — Inpatient Hospital Stay (HOSPITAL_COMMUNITY): Payer: Non-veteran care

## 2013-03-29 ENCOUNTER — Emergency Department (HOSPITAL_COMMUNITY): Payer: Non-veteran care

## 2013-03-29 ENCOUNTER — Encounter (HOSPITAL_COMMUNITY): Payer: Non-veteran care | Admitting: Anesthesiology

## 2013-03-29 ENCOUNTER — Inpatient Hospital Stay (HOSPITAL_COMMUNITY): Payer: Non-veteran care | Admitting: Anesthesiology

## 2013-03-29 ENCOUNTER — Encounter (HOSPITAL_COMMUNITY)
Admission: EM | Disposition: A | Discharge status: Skilled Nursing Facility | Payer: Self-pay | Source: Home / Self Care | Attending: Orthopedic Surgery

## 2013-03-29 ENCOUNTER — Encounter (HOSPITAL_COMMUNITY): Payer: Self-pay | Admitting: Emergency Medicine

## 2013-03-29 DIAGNOSIS — S79929A Unspecified injury of unspecified thigh, initial encounter: Secondary | ICD-10-CM | POA: Diagnosis not present

## 2013-03-29 DIAGNOSIS — S298XXA Other specified injuries of thorax, initial encounter: Secondary | ICD-10-CM | POA: Diagnosis not present

## 2013-03-29 DIAGNOSIS — S72309A Unspecified fracture of shaft of unspecified femur, initial encounter for closed fracture: Secondary | ICD-10-CM | POA: Diagnosis not present

## 2013-03-29 DIAGNOSIS — I959 Hypotension, unspecified: Secondary | ICD-10-CM | POA: Diagnosis not present

## 2013-03-29 DIAGNOSIS — IMO0002 Reserved for concepts with insufficient information to code with codable children: Secondary | ICD-10-CM

## 2013-03-29 DIAGNOSIS — S79919A Unspecified injury of unspecified hip, initial encounter: Secondary | ICD-10-CM | POA: Diagnosis not present

## 2013-03-29 DIAGNOSIS — G8929 Other chronic pain: Secondary | ICD-10-CM

## 2013-03-29 DIAGNOSIS — M25569 Pain in unspecified knee: Secondary | ICD-10-CM | POA: Diagnosis not present

## 2013-03-29 DIAGNOSIS — G8190 Hemiplegia, unspecified affecting unspecified side: Secondary | ICD-10-CM | POA: Diagnosis not present

## 2013-03-29 DIAGNOSIS — S72413A Displaced unspecified condyle fracture of lower end of unspecified femur, initial encounter for closed fracture: Secondary | ICD-10-CM | POA: Diagnosis not present

## 2013-03-29 DIAGNOSIS — G8191 Hemiplegia, unspecified affecting right dominant side: Secondary | ICD-10-CM

## 2013-03-29 DIAGNOSIS — R079 Chest pain, unspecified: Secondary | ICD-10-CM | POA: Diagnosis not present

## 2013-03-29 DIAGNOSIS — M25559 Pain in unspecified hip: Secondary | ICD-10-CM | POA: Diagnosis not present

## 2013-03-29 DIAGNOSIS — S72409A Unspecified fracture of lower end of unspecified femur, initial encounter for closed fracture: Principal | ICD-10-CM

## 2013-03-29 DIAGNOSIS — I2782 Chronic pulmonary embolism: Secondary | ICD-10-CM | POA: Diagnosis not present

## 2013-03-29 HISTORY — PX: FEMUR IM NAIL: SHX1597

## 2013-03-29 HISTORY — DX: Reserved for concepts with insufficient information to code with codable children: IMO0002

## 2013-03-29 LAB — CBC WITH DIFFERENTIAL/PLATELET
BASOS ABS: 0 10*3/uL (ref 0.0–0.1)
Basophils Relative: 0 % (ref 0–1)
EOS PCT: 0 % (ref 0–5)
Eosinophils Absolute: 0 10*3/uL (ref 0.0–0.7)
HEMATOCRIT: 39.9 % (ref 39.0–52.0)
Hemoglobin: 14.1 g/dL (ref 13.0–17.0)
LYMPHS ABS: 1.4 10*3/uL (ref 0.7–4.0)
LYMPHS PCT: 11 % — AB (ref 12–46)
MCH: 32 pg (ref 26.0–34.0)
MCHC: 35.3 g/dL (ref 30.0–36.0)
MCV: 90.5 fL (ref 78.0–100.0)
MONO ABS: 0.8 10*3/uL (ref 0.1–1.0)
Monocytes Relative: 7 % (ref 3–12)
NEUTROS ABS: 9.8 10*3/uL — AB (ref 1.7–7.7)
Neutrophils Relative %: 82 % — ABNORMAL HIGH (ref 43–77)
PLATELETS: 254 10*3/uL (ref 150–400)
RBC: 4.41 MIL/uL (ref 4.22–5.81)
RDW: 13 % (ref 11.5–15.5)
WBC: 12 10*3/uL — AB (ref 4.0–10.5)

## 2013-03-29 LAB — BASIC METABOLIC PANEL
BUN: 14 mg/dL (ref 6–23)
CALCIUM: 9.2 mg/dL (ref 8.4–10.5)
CHLORIDE: 100 meq/L (ref 96–112)
CO2: 25 meq/L (ref 19–32)
Creatinine, Ser: 0.76 mg/dL (ref 0.50–1.35)
GFR calc Af Amer: >90 mL/min (ref >90)
GFR calc non Af Amer: >90 mL/min (ref >90)
GLUCOSE: 107 mg/dL — AB (ref 70–99)
Potassium: 3.6 mEq/L — ABNORMAL LOW (ref 3.7–5.3)
SODIUM: 141 meq/L (ref 137–147)

## 2013-03-29 SURGERY — INSERTION, INTRAMEDULLARY ROD, FEMUR, RETROGRADE
Anesthesia: General | Site: Leg Upper | Laterality: Right

## 2013-03-29 MED ORDER — ONDANSETRON HCL 4 MG/2ML IJ SOLN
INTRAMUSCULAR | Status: DC | PRN
  Administered 2013-03-29: 4 mg via INTRAVENOUS

## 2013-03-29 MED ORDER — GLYCOPYRROLATE 0.2 MG/ML IJ SOLN
INTRAMUSCULAR | Status: AC
  Filled 2013-03-29: qty 2

## 2013-03-29 MED ORDER — HYDROMORPHONE HCL PF 1 MG/ML IJ SOLN
INTRAMUSCULAR | Status: DC | PRN
  Administered 2013-03-29: 1 mg via INTRAVENOUS
  Administered 2013-03-29 (×2): 0.5 mg via INTRAVENOUS
  Administered 2013-03-29 (×2): 1 mg via INTRAVENOUS

## 2013-03-29 MED ORDER — LACTATED RINGERS IV SOLN
INTRAVENOUS | Status: DC
  Administered 2013-03-29: 15:00:00 via INTRAVENOUS

## 2013-03-29 MED ORDER — ALBUMIN HUMAN 25 % IV SOLN: 50.0000 g | Freq: Once | INTRAVENOUS | Status: DC

## 2013-03-29 MED ORDER — GLYCOPYRROLATE 0.2 MG/ML IJ SOLN
INTRAMUSCULAR | Status: DC | PRN
  Administered 2013-03-29: 0.3 mg via INTRAVENOUS

## 2013-03-29 MED ORDER — LACTATED RINGERS IV SOLN
INTRAVENOUS | Status: DC | PRN
  Administered 2013-03-29 (×2): via INTRAVENOUS

## 2013-03-29 MED ORDER — ROCURONIUM BROMIDE 100 MG/10ML IV SOLN
INTRAVENOUS | Status: DC | PRN
  Administered 2013-03-29: 40 mg via INTRAVENOUS

## 2013-03-29 MED ORDER — PANTOPRAZOLE SODIUM 40 MG PO TBEC
40.0000 mg | DELAYED_RELEASE_TABLET | Freq: Every day | ORAL | Status: DC
  Administered 2013-03-30 – 2013-04-04 (×6): 40 mg via ORAL
  Filled 2013-03-29 (×6): qty 1

## 2013-03-29 MED ORDER — KETAMINE HCL 10 MG/ML IJ SOLN
INTRAMUSCULAR | Status: DC | PRN
  Administered 2013-03-29 (×5): 10 mg via INTRAVENOUS

## 2013-03-29 MED ORDER — HYDROMORPHONE HCL PF 1 MG/ML IJ SOLN
1.0000 mg | Freq: Once | INTRAMUSCULAR | Status: AC
  Administered 2013-03-29: 1 mg via INTRAVENOUS
  Filled 2013-03-29: qty 1

## 2013-03-29 MED ORDER — CEFAZOLIN SODIUM-DEXTROSE 2-3 GM-% IV SOLR
2.0000 g | Freq: Once | INTRAVENOUS | Status: AC
  Administered 2013-03-29: 2 g via INTRAVENOUS

## 2013-03-29 MED ORDER — FENTANYL CITRATE 0.05 MG/ML IJ SOLN
INTRAMUSCULAR | Status: AC
  Filled 2013-03-29: qty 5

## 2013-03-29 MED ORDER — OXYCODONE-ACETAMINOPHEN 5-325 MG PO TABS
2.0000 | ORAL_TABLET | ORAL | Status: DC | PRN
  Administered 2013-03-29: 2 via ORAL
  Filled 2013-03-29: qty 2

## 2013-03-29 MED ORDER — LIDOCAINE HCL (CARDIAC) 20 MG/ML IV SOLN
INTRAVENOUS | Status: DC | PRN
  Administered 2013-03-29: 60 mg via INTRAVENOUS

## 2013-03-29 MED ORDER — NEOSTIGMINE METHYLSULFATE 1 MG/ML IJ SOLN
INTRAMUSCULAR | Status: DC | PRN
  Administered 2013-03-29: 2 mg via INTRAVENOUS

## 2013-03-29 MED ORDER — HYDROMORPHONE HCL PF 2 MG/ML IJ SOLN
INTRAMUSCULAR | Status: AC
  Filled 2013-03-29: qty 1

## 2013-03-29 MED ORDER — FENTANYL CITRATE 0.05 MG/ML IJ SOLN
INTRAMUSCULAR | Status: DC | PRN
  Administered 2013-03-29 (×5): 50 ug via INTRAVENOUS

## 2013-03-29 MED ORDER — LIDOCAINE HCL (CARDIAC) 20 MG/ML IV SOLN
INTRAVENOUS | Status: AC
  Filled 2013-03-29: qty 5

## 2013-03-29 MED ORDER — ESMOLOL HCL 10 MG/ML IV SOLN
INTRAVENOUS | Status: DC | PRN
  Administered 2013-03-29: 20 mg via INTRAVENOUS

## 2013-03-29 MED ORDER — 0.9 % SODIUM CHLORIDE (POUR BTL) OPTIME
TOPICAL | Status: DC | PRN
  Administered 2013-03-29: 1000 mL

## 2013-03-29 MED ORDER — ISOPROPYL ALCOHOL 70 % SOLN
Status: AC
  Filled 2013-03-29: qty 480

## 2013-03-29 MED ORDER — MIDAZOLAM HCL 2 MG/2ML IJ SOLN
INTRAMUSCULAR | Status: AC
  Filled 2013-03-29: qty 2

## 2013-03-29 MED ORDER — PHENYLEPHRINE HCL 10 MG/ML IJ SOLN
INTRAMUSCULAR | Status: DC | PRN
  Administered 2013-03-29 (×3): 40 ug via INTRAVENOUS

## 2013-03-29 MED ORDER — ONDANSETRON HCL 4 MG/2ML IJ SOLN
INTRAMUSCULAR | Status: AC
  Filled 2013-03-29: qty 2

## 2013-03-29 MED ORDER — ROCURONIUM BROMIDE 100 MG/10ML IV SOLN
INTRAVENOUS | Status: AC
  Filled 2013-03-29: qty 1

## 2013-03-29 MED ORDER — PROPOFOL 10 MG/ML IV BOLUS
INTRAVENOUS | Status: AC
  Filled 2013-03-29: qty 20

## 2013-03-29 MED ORDER — CEFAZOLIN SODIUM-DEXTROSE 2-3 GM-% IV SOLR
INTRAVENOUS | Status: AC
  Filled 2013-03-29: qty 50

## 2013-03-29 MED ORDER — IRBESARTAN 300 MG PO TABS
300.0000 mg | ORAL_TABLET | Freq: Every day | ORAL | Status: DC
  Filled 2013-03-29 (×2): qty 1

## 2013-03-29 MED ORDER — PROPOFOL 10 MG/ML IV BOLUS
INTRAVENOUS | Status: DC | PRN
  Administered 2013-03-29: 140 mg via INTRAVENOUS
  Administered 2013-03-29: 20 mg via INTRAVENOUS

## 2013-03-29 MED ORDER — LORAZEPAM 2 MG/ML IJ SOLN
1.0000 mg | Freq: Once | INTRAMUSCULAR | Status: AC
  Administered 2013-03-29: 1 mg via INTRAVENOUS
  Filled 2013-03-29: qty 1

## 2013-03-29 MED ORDER — PHENYLEPHRINE 40 MCG/ML (10ML) SYRINGE FOR IV PUSH (FOR BLOOD PRESSURE SUPPORT)
PREFILLED_SYRINGE | INTRAVENOUS | Status: AC
  Filled 2013-03-29: qty 10

## 2013-03-29 MED ORDER — FENTANYL CITRATE 0.05 MG/ML IJ SOLN
50.0000 ug | INTRAMUSCULAR | Status: DC | PRN
  Administered 2013-03-29 – 2013-04-01 (×24): 50 ug via INTRAVENOUS
  Filled 2013-03-29 (×24): qty 2

## 2013-03-29 MED ORDER — HEPARIN SODIUM (PORCINE) 5000 UNIT/ML IJ SOLN
5000.0000 [IU] | Freq: Three times a day (TID) | INTRAMUSCULAR | Status: DC
Start: 2013-03-29 — End: 2013-03-30
  Filled 2013-03-29 (×3): qty 1

## 2013-03-29 MED ORDER — SODIUM CHLORIDE 0.9 % IJ SOLN: 3.0000 mL | Freq: Two times a day (BID) | INTRAMUSCULAR | Status: DC

## 2013-03-29 SURGICAL SUPPLY — 55 items
BAG SPEC THK2 15X12 ZIP CLS (MISCELLANEOUS) ×1
BAG ZIPLOCK 12X15 (MISCELLANEOUS) ×2 IMPLANT
BANDAGE ELASTIC 6 VELCRO ST LF (GAUZE/BANDAGES/DRESSINGS) ×2 IMPLANT
BIT DRILL CALIBRATED 4.3MMX365 (DRILL) ×1 IMPLANT
BIT DRILL CROWE PNT TWST 4.5MM (DRILL) ×1 IMPLANT
BNDG COHESIVE 6X5 TAN STRL LF (GAUZE/BANDAGES/DRESSINGS) ×2 IMPLANT
DRAPE C-ARM 42X120 X-RAY (DRAPES) ×2 IMPLANT
DRAPE C-ARMOR (DRAPES) ×2 IMPLANT
DRAPE ORTHO SPLIT 77X108 STRL (DRAPES) ×2
DRAPE STERI IOBAN 125X83 (DRAPES) ×2 IMPLANT
DRAPE SURG ORHT 6 SPLT 77X108 (DRAPES) ×2 IMPLANT
DRAPE TABLE BACK 44X90 PK DISP (DRAPES) ×2 IMPLANT
DRILL CALIBRATED 4.3MMX365 (DRILL) ×2
DRILL CROWE POINT TWIST 4.5MM (DRILL) ×2
DRSG MEPILEX BORDER 4X4 (GAUZE/BANDAGES/DRESSINGS) ×2 IMPLANT
DURAPREP 26ML APPLICATOR (WOUND CARE) ×2 IMPLANT
ELECT REM PT RETURN 9FT ADLT (ELECTROSURGICAL) ×2
ELECTRODE REM PT RTRN 9FT ADLT (ELECTROSURGICAL) ×1 IMPLANT
GAUZE XEROFORM 1X8 LF (GAUZE/BANDAGES/DRESSINGS) ×2 IMPLANT
GLOVE BIO SURGEON STRL SZ7.5 (GLOVE) ×2 IMPLANT
GLOVE BIO SURGEON STRL SZ8 (GLOVE) IMPLANT
GLOVE BIOGEL PI IND STRL 7.5 (GLOVE) ×1 IMPLANT
GLOVE BIOGEL PI IND STRL 8 (GLOVE) ×2 IMPLANT
GLOVE BIOGEL PI INDICATOR 7.5 (GLOVE) ×1
GLOVE BIOGEL PI INDICATOR 8 (GLOVE) ×2
GLOVE SURG ORTHO 8.0 STRL STRW (GLOVE) ×4 IMPLANT
GOWN STRL REUS W/TWL 2XL LVL3 (GOWN DISPOSABLE) ×4 IMPLANT
GOWN STRL REUS W/TWL LRG LVL3 (GOWN DISPOSABLE) IMPLANT
GOWN STRL REUS W/TWL XL LVL3 (GOWN DISPOSABLE) ×2 IMPLANT
GUIDEWIRE BEAD TIP (WIRE) ×2 IMPLANT
GUIDEWIRE LAGSCREW 3.2X460 (WIRE) ×2 IMPLANT
KIT BASIN OR (CUSTOM PROCEDURE TRAY) ×2 IMPLANT
MANIFOLD NEPTUNE II (INSTRUMENTS) ×2 IMPLANT
NAIL FEM RETRO 12X380 (Nail) ×2 IMPLANT
NS IRRIG 1000ML POUR BTL (IV SOLUTION) ×2 IMPLANT
PACK GENERAL/GYN (CUSTOM PROCEDURE TRAY) IMPLANT
PACK TOTAL JOINT (CUSTOM PROCEDURE TRAY) ×2 IMPLANT
PAD ABD 8X10 STRL (GAUZE/BANDAGES/DRESSINGS) ×2 IMPLANT
POSITIONER SURGICAL ARM (MISCELLANEOUS) ×2 IMPLANT
SCREW CORT TI DBL LEAD 5X30 (Screw) ×2 IMPLANT
SCREW CORT TI DBL LEAD 5X58 (Screw) ×2 IMPLANT
SCREW CORT TI DBL LEAD 5X60 (Screw) ×2 IMPLANT
SCREW CORT TI DBL LEAD 5X65 (Screw) ×2 IMPLANT
SCREW CORT TI DBL LEAD 5X80 (Screw) ×2 IMPLANT
SCREW CORT TI DBL LEAD 5X85 (Screw) ×2 IMPLANT
SPONGE GAUZE 4X4 12PLY (GAUZE/BANDAGES/DRESSINGS) IMPLANT
STAPLER VISISTAT 35W (STAPLE) ×2 IMPLANT
SUT ETHILON 3 0 PS 1 (SUTURE) ×2 IMPLANT
SUT VIC AB 0 CT1 27 (SUTURE) ×2
SUT VIC AB 0 CT1 27XBRD ANTBC (SUTURE) ×2 IMPLANT
SUT VIC AB 2-0 CT1 27 (SUTURE) ×1
SUT VIC AB 2-0 CT1 TAPERPNT 27 (SUTURE) ×1 IMPLANT
TOWEL OR 17X26 10 PK STRL BLUE (TOWEL DISPOSABLE) ×4 IMPLANT
TRAY FOLEY CATH 14FRSI W/METER (CATHETERS) IMPLANT
TRAY FOLEY CATH 16FRSI W/METER (SET/KITS/TRAYS/PACK) ×2 IMPLANT

## 2013-03-29 NOTE — ED Provider Notes (Signed)
6:00 AM = Received sign out from [employer]. If MRI shows fx admit to medicine. If MRI shows no fx admit to orthopedics.   Filed Vitals:   03/29/13 0931 03/29/13 1134 03/29/13 1343 03/29/13 1436  BP:  132/82  132/72  Pulse: 99 104 109 105  Temp:      TempSrc:      Resp: 18 17 25 26   SpO2: 97% 96% 95% 95%     Results for orders placed during the hospital encounter of 03/28/13  CBC WITH DIFFERENTIAL      Result Value Ref Range   WBC 12.0 (*) 4.0 - 10.5 K/uL   RBC 4.41  4.22 - 5.81 MIL/uL   Hemoglobin 14.1  13.0 - 17.0 g/dL   HCT 33.2  95.1 - 88.4 %   MCV 90.5  78.0 - 100.0 fL   MCH 32.0  26.0 - 34.0 pg   MCHC 35.3  30.0 - 36.0 g/dL   RDW 16.6  06.3 - 01.6 %   Platelets 254  150 - 400 K/uL   Neutrophils Relative % 82 (*) 43 - 77 %   Neutro Abs 9.8 (*) 1.7 - 7.7 K/uL   Lymphocytes Relative 11 (*) 12 - 46 %   Lymphs Abs 1.4  0.7 - 4.0 K/uL   Monocytes Relative 7  3 - 12 %   Monocytes Absolute 0.8  0.1 - 1.0 K/uL   Eosinophils Relative 0  0 - 5 %   Eosinophils Absolute 0.0  0.0 - 0.7 K/uL   Basophils Relative 0  0 - 1 %   Basophils Absolute 0.0  0.0 - 0.1 K/uL  BASIC METABOLIC PANEL      Result Value Ref Range   Sodium 141  137 - 147 mEq/L   Potassium 3.6 (*) 3.7 - 5.3 mEq/L   Chloride 100  96 - 112 mEq/L   CO2 25  19 - 32 mEq/L   Glucose, Bld 107 (*) 70 - 99 mg/dL   BUN 14  6 - 23 mg/dL   Creatinine, Ser 0.10  0.50 - 1.35 mg/dL   Calcium 9.2  8.4 - 93.2 mg/dL   GFR calc non Af Amer >90  >90 mL/min   GFR calc Af Amer >90  >90 mL/min        CT Hip Right Wo Contrast (Final result)  Result time: 03/29/13 08:59:40    Final result by Rad Results In Interface (03/29/13 08:59:40)    Narrative:   CLINICAL DATA: Right hip pain secondary to a fall.  EXAM: CT OF THE RIGHT HIP WITHOUT CONTRAST  TECHNIQUE: Multidetector CT imaging was performed according to the standard protocol. Multiplanar CT image reconstructions were also generated.  COMPARISON: Radiographs  dated 03/29/2013  FINDINGS: There is no fracture or dislocation or hip joint effusion. The visualized pelvic bones are intact. There is a subtle soft tissue contusion in the subcutaneous fat overlying the lateral aspect of the right greater trochanter.  IMPRESSION: No fractures or dislocations. Soft tissue contusion as described.   Electronically Signed By: Geanie Cooley M.D. On: 03/29/2013 08:59      MR ATTEMPTED STUDY-NO REPORT (In process)      Procedure changed from MR Hip Right Wo Contrast        DG Hip Complete Right (Final result)  Result time: 03/29/13 01:00:06    Final result by Rad Results In Interface (03/29/13 01:00:06)    Narrative:   CLINICAL DATA: Fall, right hip pain. Distal right  femur fracture.  EXAM: RIGHT HIP - COMPLETE 2+ VIEW  COMPARISON: DG KNEE1- 2 VIEWS*R* dated 03/28/2013; DG TIBIA/FIBULA*L* dated 06/26/2010  FINDINGS: Non standard views are provided due to patient inability and discomfort.  Hips are located. There is subtle cortical irregularity along the superior aspect of the right femoral neck. This may represent osteophytosis but cannot exclude of femoral neck fracture.  IMPRESSION: Nonstandard views. Difficult to exclude a subtle right femoral neck fracture. If clinical concern, recommend CT the pelvis in this difficult patient.   Electronically Signed By: Genevive Bi M.D. On: 03/29/2013 01:00      DG Ribs Unilateral W/Chest Left (Final result)  Result time: 03/29/13 01:02:42    Final result by Rad Results In Interface (03/29/13 01:02:42)    Narrative:   CLINICAL DATA: Fall, left anterior rib pain  EXAM: LEFT RIBS AND CHEST - 3+ VIEW  COMPARISON: None.  FINDINGS: Normal mediastinum and cardiac silhouette. Chronic central bronchitic markings. Normal pulmonary vasculature. No effusion, infiltrate, or pneumothorax.  Dedicated views of the left ribs demonstrate no displaced fracture.  IMPRESSION: No acute  findings. Chronic bronchitic markings centrally.  No evidence of left rib fracture.   Electronically Signed By: Genevive Bi M.D. On: 03/29/2013 01:02      DG Knee 1-2 Views Right (Final result)  Result time: 03/28/13 22:56:01    Procedure changed from DG Knee Complete 4 Views Right       Final result by Rad Results In Interface (03/28/13 22:56:01)    Narrative:   CLINICAL DATA: Fall, right knee pain.  EXAM: RIGHT KNEE - 1-2 VIEW  COMPARISON: There is a fracture through the  FINDINGS: Study is limited by the patient's inability to straighten leg. There is a fracture noted on the lateral view only in the region of the distal femoral metaphysis. I see no joint effusion within the right knee, so this may be extra-articular. No subluxation or dislocation.  IMPRESSION: Distal femoral metaphyseal fracture seen only on the lateral view. Limited study due to the patient's inability to straighten leg. No joint effusion.   Electronically Signed By: Charlett Nose M.D. On: 03/28/2013 22:56         Rechecks  6:37 AM = Patient going for MRI. 1 mg Ativan ordered for anxiety.  7:33 AM = Patient unable to get MRI due to patient's contraction. Ordering CT hip.  8:00 AM = Patient sleeping. Tired after Ativan per ex-wife. Patient uses a wheelchair 90% of the time and uses a walker for short distances.  11:15 AM = Pain returning. Ordering 1 mg dilaudid.    Consults  8:45 AM = Spoke with Dr. August Saucer. Informed that we were unable to get MRI and CT is pending. Would like a call back with results.  9:25 AM = Spoke with Dr. August Saucer who was informed of negative CT results. Medical admission. Will consult. Likely will take to OR tonight. Will see patient this afternoon. Hold xarelto. Patient on xarelto for previous pulmonary embolism. Last dose Tuesday.  10:15 AM = Spoke with Dr. Mahala Menghini who is calling Dr. August Saucer. Will call back.  10:30 AM = Spoke with Dr. Mahala Menghini who is coming to see the  patient.  11:15 AM = Dr. Mahala Menghini called back. Patient will stay in the ED as there are no beds.     Patient will be taken to the OR for repair of a distal metaphyseal fracture with Dr. August Saucer. Patient admitted to the medicine service. Will stay in ED as there are no  beds currently. Patient in agreement with plan.  Final impressions: 1. Femoral distal fracture   2. Fall   3. Paralysis   4. Hx pulmonary embolism      Luiz IronJessica Katlin Solveig Fangman PA-C   This patient was discussed with Dr. Meryl DareWard        Shalin Linders K Ruthvik Barnaby, PA-C 03/29/13 1442

## 2013-03-29 NOTE — ED Notes (Signed)
Patient transported to X-ray 
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
## 2013-03-29 NOTE — Progress Notes (Signed)
UR completed 

## 2013-03-29 NOTE — ED Notes (Signed)
Dr Dean at bedside.

## 2013-03-29 NOTE — Preoperative (Signed)
Beta Blockers   Reason not to administer Beta Blockers:Not Applicable 

## 2013-03-29 NOTE — ED Provider Notes (Signed)
Medical screening examination/treatment/procedure(s) were performed by non-physician practitioner and as supervising physician I was immediately available for consultation/collaboration.  EKG Interpretation   None         Layla MawKristen N Ward, DO 03/29/13 1512
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
## 2013-03-29 NOTE — Progress Notes (Signed)
i will admit to my service post surgery

## 2013-03-29 NOTE — ED Provider Notes (Signed)
Medical screening examination/treatment/procedure(s) were conducted as a shared visit with non-physician practitioner(s) and myself.  I personally evaluated the patient during the encounter.   Patient with fall. Knee fracture.   Lucas RudeNathan R. Rubin PayorPickering, MD 03/29/13 [phone]

## 2013-03-29 NOTE — ED Notes (Signed)
Pt refused to let Ortho tech put splint on leg

## 2013-03-29 NOTE — Progress Notes (Signed)
Received verbal orders from Dr. August Saucerean for pt to go to OR for ORIF of R femur fracture. Pt's daughter signed consent form. Pt and pt's family with no further questions. Telemetry removed and pt transported to OR.

## 2013-03-29 NOTE — ED Notes (Signed)
Patient transported to MRI 

## 2013-03-29 NOTE — ED Notes (Signed)
Family at bedside.  Mouth swabs given to provide oral care and eyes wiped w/wet washcloth

## 2013-03-29 NOTE — ED Notes (Signed)
"  Triage" at [facility] called and will relay message to Dr. Diamantina Providenceean's assistant who will call back with the plan of care so it can be told to the family.

## 2013-03-29 NOTE — Progress Notes (Signed)
Called for report, nurse said she will call me back in a few mins.

## 2013-03-29 NOTE — ED Notes (Signed)
Patient transported to CT 

## 2013-03-30 ENCOUNTER — Encounter (HOSPITAL_COMMUNITY): Payer: Self-pay | Admitting: Orthopedic Surgery

## 2013-03-30 ENCOUNTER — Inpatient Hospital Stay (HOSPITAL_COMMUNITY): Payer: Non-veteran care

## 2013-03-30 DIAGNOSIS — M259 Joint disorder, unspecified: Secondary | ICD-10-CM | POA: Diagnosis not present

## 2013-03-30 DIAGNOSIS — S72409A Unspecified fracture of lower end of unspecified femur, initial encounter for closed fracture: Secondary | ICD-10-CM | POA: Diagnosis not present

## 2013-03-30 LAB — COMPREHENSIVE METABOLIC PANEL
ALBUMIN: 2.8 g/dL — AB (ref 3.5–5.2)
ALK PHOS: 56 U/L (ref 39–117)
ALT: 21 U/L (ref 0–53)
AST: 43 U/L — ABNORMAL HIGH (ref 0–37)
BUN: 8 mg/dL (ref 6–23)
CO2: 20 mEq/L (ref 19–32)
Calcium: 8.1 mg/dL — ABNORMAL LOW (ref 8.4–10.5)
Chloride: 97 mEq/L (ref 96–112)
Creatinine, Ser: 0.68 mg/dL (ref 0.50–1.35)
GFR calc non Af Amer: >90 mL/min (ref >90)
Glucose, Bld: 85 mg/dL (ref 70–99)
POTASSIUM: 3.7 meq/L (ref 3.7–5.3)
Sodium: 134 mEq/L — ABNORMAL LOW (ref 137–147)
TOTAL PROTEIN: 5.3 g/dL — AB (ref 6.0–8.3)
Total Bilirubin: 1.3 mg/dL — ABNORMAL HIGH (ref 0.3–1.2)

## 2013-03-30 LAB — CBC
HCT: 34.2 % — ABNORMAL LOW (ref 39.0–52.0)
Hemoglobin: 11.8 g/dL — ABNORMAL LOW (ref 13.0–17.0)
MCH: 31.5 pg (ref 26.0–34.0)
MCHC: 34.2 g/dL (ref 30.0–36.0)
MCV: 91.9 fL (ref 78.0–100.0)
Platelets: 190 10*3/uL (ref 150–400)
RBC: 3.72 MIL/uL — ABNORMAL LOW (ref 4.22–5.81)
RDW: 13.2 % (ref 11.5–15.5)
WBC: 10 10*3/uL (ref 4.0–10.5)

## 2013-03-30 LAB — PROTIME-INR
INR: 1.12 (ref 0.00–1.49)
PROTHROMBIN TIME: 14.2 s (ref 11.6–15.2)

## 2013-03-30 MED ORDER — DOCUSATE SODIUM 100 MG PO CAPS
100.0000 mg | ORAL_CAPSULE | Freq: Two times a day (BID) | ORAL | Status: DC
  Administered 2013-03-30 – 2013-03-31 (×2): 100 mg via ORAL
  Filled 2013-03-30 (×8): qty 1

## 2013-03-30 MED ORDER — METOCLOPRAMIDE HCL 5 MG PO TABS
5.0000 mg | ORAL_TABLET | Freq: Three times a day (TID) | ORAL | Status: DC | PRN
  Filled 2013-03-30: qty 2

## 2013-03-30 MED ORDER — ACETAMINOPHEN 650 MG RE SUPP: 650.0000 mg | Freq: Four times a day (QID) | RECTAL | Status: DC | PRN

## 2013-03-30 MED ORDER — METHOCARBAMOL 500 MG PO TABS: 500.0000 mg | ORAL_TABLET | Freq: Four times a day (QID) | ORAL | Status: DC | PRN

## 2013-03-30 MED ORDER — LACTATED RINGERS IV SOLN: INTRAVENOUS | Status: DC

## 2013-03-30 MED ORDER — CEFAZOLIN SODIUM-DEXTROSE 2-3 GM-% IV SOLR
2.0000 g | Freq: Three times a day (TID) | INTRAVENOUS | Status: AC
  Administered 2013-03-30 (×2): 2 g via INTRAVENOUS
  Filled 2013-03-30 (×2): qty 50

## 2013-03-30 MED ORDER — METOCLOPRAMIDE HCL 5 MG/ML IJ SOLN: 5.0000 mg | Freq: Three times a day (TID) | INTRAMUSCULAR | Status: DC | PRN

## 2013-03-30 MED ORDER — RIVAROXABAN 20 MG PO TABS
20.0000 mg | ORAL_TABLET | Freq: Every day | ORAL | Status: DC
  Administered 2013-03-30 – 2013-04-03 (×5): 20 mg via ORAL
  Filled 2013-03-30 (×6): qty 1

## 2013-03-30 MED ORDER — FENTANYL CITRATE 0.05 MG/ML IJ SOLN: 25.0000 ug | INTRAMUSCULAR | Status: DC | PRN

## 2013-03-30 MED ORDER — RIVAROXABAN 15 MG PO TABS
15.0000 mg | ORAL_TABLET | Freq: Every day | ORAL | Status: DC
  Filled 2013-03-30: qty 1

## 2013-03-30 MED ORDER — ACETAMINOPHEN 325 MG PO TABS: 650.0000 mg | ORAL_TABLET | Freq: Four times a day (QID) | ORAL | Status: DC | PRN

## 2013-03-30 MED ORDER — CARVEDILOL 3.125 MG PO TABS
3.1250 mg | ORAL_TABLET | Freq: Two times a day (BID) | ORAL | Status: DC
  Administered 2013-03-30 – 2013-04-04 (×8): 3.125 mg via ORAL
  Filled 2013-03-30 (×12): qty 1

## 2013-03-30 MED ORDER — MENTHOL 3 MG MT LOZG
1.0000 | LOZENGE | OROMUCOSAL | Status: DC | PRN
  Filled 2013-03-30: qty 9

## 2013-03-30 MED ORDER — OXYCODONE HCL 5 MG PO TABS: 5.0000 mg | ORAL_TABLET | ORAL | Status: DC | PRN

## 2013-03-30 MED ORDER — ONDANSETRON HCL 4 MG/2ML IJ SOLN: 4.0000 mg | Freq: Four times a day (QID) | INTRAMUSCULAR | Status: DC | PRN

## 2013-03-30 MED ORDER — PROMETHAZINE HCL 25 MG/ML IJ SOLN: 6.2500 mg | INTRAMUSCULAR | Status: DC | PRN

## 2013-03-30 MED ORDER — ONDANSETRON HCL 4 MG PO TABS: 4.0000 mg | ORAL_TABLET | Freq: Four times a day (QID) | ORAL | Status: DC | PRN

## 2013-03-30 MED ORDER — PHENOL 1.4 % MT LIQD
1.0000 | OROMUCOSAL | Status: DC | PRN
  Filled 2013-03-30: qty 177

## 2013-03-30 MED ORDER — POTASSIUM CHLORIDE IN NACL 20-0.9 MEQ/L-% IV SOLN
INTRAVENOUS | Status: AC
  Administered 2013-03-30 (×2): via INTRAVENOUS
  Administered 2013-03-31: 75 mL/h via INTRAVENOUS
  Administered 2013-03-31: 04:00:00 via INTRAVENOUS
  Filled 2013-03-30 (×4): qty 1000

## 2013-03-30 MED ORDER — DEXTROSE 5 % IV SOLN
500.0000 mg | Freq: Four times a day (QID) | INTRAVENOUS | Status: DC | PRN
  Filled 2013-03-30: qty 5

## 2013-03-30 MED ORDER — MEPERIDINE HCL 50 MG/ML IJ SOLN: 6.2500 mg | INTRAMUSCULAR | Status: DC | PRN

## 2013-03-30 MED ORDER — IRBESARTAN 150 MG PO TABS
150.0000 mg | ORAL_TABLET | Freq: Every day | ORAL | Status: DC
  Administered 2013-03-31: 150 mg via ORAL
  Filled 2013-03-30 (×3): qty 1

## 2013-03-30 NOTE — Progress Notes (Signed)
Subjective: Pt stable - pain seems controlled   Objective: Vital signs in last 24 hours: Temp:  [97.8 F (36.6 C)-99.3 F (37.4 C)] 98.2 F (36.8 C) (02/20 1332) Pulse Rate:  [98-120] 112 (02/20 1718) Resp:  [19-25] 20 (02/20 1600) BP: (91-128)/(50-81) 91/50 mmHg (02/20 1332) SpO2:  [91 %-100 %] 91 % (02/20 1600)  Intake/Output from previous day: 02/19 0701 - 02/20 0700 In: 1810 [I.V.:1810] Out: 1485 [Urine:1335; Blood:150] Intake/Output this shift: Total I/O In: -  Out: 400 [Urine:400]  Exam:  Intact pulses distally No cellulitis present Compartment soft  Labs:  Recent Labs  03/29/13 0159 03/30/13 0630  HGB 14.1 11.8*    Recent Labs  03/29/13 0159 03/30/13 0630  WBC 12.0* 10.0  RBC 4.41 3.72*  HCT 39.9 34.2*  PLT 254 190    Recent Labs  03/29/13 0159 03/30/13 0630  NA 141 134*  K 3.6* 3.7  CL 100 97  CO2 25 20  BUN 14 8  CREATININE 0.76 0.68  GLUCOSE 107* 85  CALCIUM 9.2 8.1*    Recent Labs  03/30/13 0630  INR 1.12    Assessment/Plan: Pt stable - pain seems ok on pain meds - on xarelto - continue   Nycole Kawahara SCOTT 03/30/2013, 5:46 PM
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
## 2013-03-30 NOTE — Transfer of Care (Signed)
Immediate Anesthesia Transfer of Care Note  Patient: Lucas Harper  Procedure(s) Performed: Procedure(s): INTRAMEDULLARY (IM) RETROGRADE FEMORAL NAILING (Right)  Patient Location: PACU  Anesthesia Type:General  Level of Consciousness: sedated, patient cooperative and responds to stimulation  Airway & Oxygen Therapy: Patient Spontanous Breathing and Patient connected to face mask oxygen  Post-op Assessment: Report given to PACU RN and Post -op Vital signs reviewed and stable  Post vital signs: Reviewed and stable  Complications: No apparent anesthesia complications
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
## 2013-03-30 NOTE — Progress Notes (Signed)
Clinical Social Work Department CLINICAL SOCIAL WORK PLACEMENT NOTE 03/30/2013  Patient:  Lucas Harper,Lucas Harper  Account Number:  [id number] Admit date:  03/28/2013  Clinical Social Worker:  Orpah GreekKELLY FOLEY, LCSWA  Date/time:  03/30/2013 02:06 PM  Clinical Social Work is seeking post-discharge placement for this patient at the following level of care:   SKILLED NURSING   (*CSW will update this form in Epic as items are completed)   03/30/2013  Patient/family provided with [facility] of Clinical Social Work's list of facilities offering this level of care within the geographic area requested by the patient (or if unable, by the patient's family).  03/30/2013  Patient/family informed of their freedom to choose among providers that offer the needed level of care, that participate in Medicare, Medicaid or managed care program needed by the patient, have an available bed and are willing to accept the patient.  03/30/2013  Patient/family informed of [facility]' ownership interest in [facility], as well as of the fact that they are under no obligation to receive care at this facility.  PASARR submitted to EDS on 03/30/2013 PASARR number received from EDS on 03/30/2013  FL2 transmitted to all facilities in geographic area requested by pt/family on  03/30/2013 FL2 transmitted to all facilities within larger geographic area on   Patient informed that his/her managed care company has contracts with or will negotiate with  certain facilities, including the following:     Patient/family informed of bed offers received:   Patient chooses bed at  Physician recommends and patient chooses bed at    Patient to be transferred to  on   Patient to be transferred to facility by   The following physician request were entered in Epic:   Additional Comments:   Lincoln MaxinKelly Raphaela Cannaday, LCSW [facility] Clinical Social Worker cell #: [phone]

## 2013-03-30 NOTE — Progress Notes (Signed)
Clinical Social Work Department BRIEF PSYCHOSOCIAL ASSESSMENT 03/30/2013  Patient:  [name],Jakyri H     Account Number:  [id number]     Admit date:  03/28/2013  Clinical Social Worker:  Orpah GreekFOLEY,Fatih Stalvey, LCSWA  Date/Time:  03/30/2013 01:57 PM  Referred by:  Physician  Date Referred:  03/30/2013 Referred for  SNF Placement   Other Referral:   Interview type:  Patient Other interview type:   and his daughter, Ava at bedside    PSYCHOSOCIAL DATA Living Status:  ALONE Admitted from facility:   Level of care:   Primary support name:  Oval LinseyDonna Mattox (sister) ph#: [phone] Primary support relationship to patient:  SIBLING Degree of support available:   good    CURRENT CONCERNS Current Concerns  Post-Acute Placement   Other Concerns:    SOCIAL WORK ASSESSMENT / PLAN CSW received consult for SNF placement as [facility].   Assessment/plan status:  [facility] Other assessment/ plan:   Information/referral to community resources:   CSW completed FL2 and [facility] - provided list of facilities to patient & daughter at bedside, will follow-up with bed offers when available.    PATIENT'S/FAMILY'S RESPONSE TO PLAN OF CARE: Patient's daughter states that they are hopeful to stay in a hospital setting such as [facility], but if that isn't possible they would be open to a SNF/Rehab facility. CSW will follow-up with bed offers when they're available.       Lincoln MaxinKelly Zerick Prevette, LCSW [facility] Clinical Social Worker cell #: [phone]

## 2013-03-30 NOTE — Progress Notes (Signed)
Rehab Admissions Coordinator Note:  Patient was screened by Clois DupesBoyette, Hagar Sadiq Godwin for appropriateness for an Inpatient Acute Rehab Consult.  At this time, we are recommending Inpatient Rehab consult. Please place order per PT and OT recommendations if pt wants to pursue rather than SNF placement.  Clois DupesBoyette, Venessa Wickham Godwin 03/30/2013, 11:14 AM  I can be reached at [phone].

## 2013-03-30 NOTE — Op Note (Signed)
[name]:  [name], [name]                 ACCOUNT NO.:  [id number]  MEDICAL RECORD NO.:  [id number]  LOCATION:  1443                         [facility]  PHYSICIAN:  Burnard BuntingG. Scott Chidi Shirer, M.D.    DATE OF BIRTH:  1955/10/13  DATE OF PROCEDURE: DATE OF DISCHARGE:                              OPERATIVE REPORT   PREOPERATIVE DIAGNOSIS:  Right distal femur fracture.  POSTOP:  Right distal femur fracture.  PROCEDURE:  Retrograde IM nail, right distal femur fracture.  SURGEONS:  Burnard BuntingG. Scott Charmain Diosdado, MD.  ASSISTANT:  None.  ANESTHESIA:  General endotracheal.  ESTIMATED BLOOD LOSS:  200 mL.  DRAINS:  None.  INDICATIONS:  Lucas Harper is a patient with right distal femur fracture, who presents for operative management after explanation of risks and benefits.  IMPLANT UTILIZED:  Biomet 12 x 38 nail, retrograde for distal interlock holes proximal interlock.  PROCEDURE IN DETAIL:  The patient was brought to the operating room, where general endotracheal anesthesia was induced.  Preoperative antibiotics were administered.  Time-out was called.  Right leg was prescrubbed with alcohol and Betadine, which was allowed to air dry, prepped with DuraPrep solution and draped in a sterile manner.  Collier Flowersoban was used to cover the operative field.  Under fluoroscopic guidance, incision was made through the patellar tendon.  Guide pin was placed in the most anterior portion of the intercondylar notch near Blumensaat's line.  Then under fluoroscopic guidance, a guide pin was advanced proximally.  Proximal reaming was performed with soft tissue protection in place.  The knee joint was thoroughly irrigated.  The femur was then reamed up to a 13.5 mm, a 12 mm nail was then placed and locked x4 distally under fluoroscopic guidance and x1 proximally under fluoroscopic guidance.  This done through a proximal incision.  Blunt dissection was taken down to the femoral shaft.  All interlocking screws were checked with  fluoroscopy.  At this time, thorough irrigation was performed on base incisions, which were then closed using 0 Vicryl suture, 2-0 Vicryl suture and skin staples.  The interlocking incisions were closed using a 2-0 Vicryl suture and staples.  Bulky dressing and a compressive wrap placed around the thigh.  The patient tolerated the procedure well without immediate complications. Transferred to recovery room in stable condition.     Burnard BuntingG. Scott Jonaven Hilgers, M.D.     GSD/MEDQ  D:  03/30/2013  T:  03/30/2013  Job:  [id number]
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
## 2013-03-30 NOTE — Brief Op Note (Signed)
03/28/2013 - 03/30/2013  12:12 AM  PATIENT:  Nicola PoliceEugene H Kugelman  58 y.o. male  PRE-OPERATIVE DIAGNOSIS:  right femerol fracture  POST-OPERATIVE DIAGNOSIS:  right femoral fracture  PROCEDURE:  Procedure(s): INTRAMEDULLARY (IM) RETROGRADE FEMORAL NAILING  SURGEON:  Surgeon(s): Cammy CopaGregory Scott Vassie Kugel, MD  ASSISTANT: none  ANESTHESIA:   general  EBL: 200 ml    Total I/O In: 1710 [I.V.:1710] Out: 750 [Urine:600; Blood:150]  BLOOD ADMINISTERED: none  DRAINS: none   LOCAL MEDICATIONS USED:  none  SPECIMEN:  No Specimen  COUNTS:  YES  TOURNIQUET:  * No tourniquets in log *  DICTATION: .Other Dictation: Dictation Number done  PLAN OF CARE: Admit to inpatient   PATIENT DISPOSITION:  PACU - hemodynamically stable

## 2013-03-31 LAB — BASIC METABOLIC PANEL
BUN: 11 mg/dL (ref 6–23)
CHLORIDE: 104 meq/L (ref 96–112)
CO2: 21 mEq/L (ref 19–32)
Calcium: 7.9 mg/dL — ABNORMAL LOW (ref 8.4–10.5)
Creatinine, Ser: 0.7 mg/dL (ref 0.50–1.35)
GFR calc Af Amer: >90 mL/min (ref >90)
GFR calc non Af Amer: >90 mL/min (ref >90)
Glucose, Bld: 127 mg/dL — ABNORMAL HIGH (ref 70–99)
POTASSIUM: 4 meq/L (ref 3.7–5.3)
Sodium: 139 mEq/L (ref 137–147)

## 2013-03-31 LAB — PROTIME-INR
INR: 1.89 — ABNORMAL HIGH (ref 0.00–1.49)
PROTHROMBIN TIME: 21.1 s — AB (ref 11.6–15.2)

## 2013-03-31 LAB — CBC
HCT: 28.7 % — ABNORMAL LOW (ref 39.0–52.0)
Hemoglobin: 9.6 g/dL — ABNORMAL LOW (ref 13.0–17.0)
MCH: 30.7 pg (ref 26.0–34.0)
MCHC: 33.4 g/dL (ref 30.0–36.0)
MCV: 91.7 fL (ref 78.0–100.0)
Platelets: 150 10*3/uL (ref 150–400)
RBC: 3.13 MIL/uL — ABNORMAL LOW (ref 4.22–5.81)
RDW: 13.2 % (ref 11.5–15.5)
WBC: 9 10*3/uL (ref 4.0–10.5)

## 2013-03-31 NOTE — Progress Notes (Signed)
Brief f/u  Chart reviewed, no  Longer as tachy Nursing reports no outstadning medical issues and patient much more improved.     Pleas KochJai Danny Zimny, MD [facility] [phone]

## 2013-03-31 NOTE — Progress Notes (Signed)
Subjective: 2 Days Post-Op Procedure(s) (LRB): INTRAMEDULLARY (IM) RETROGRADE FEMORAL NAILING (Right) Patient reports pain as moderate.  Complaining of right heel pain.  Objective: Vital signs in last 24 hours: Temp:  [97.4 F (36.3 C)-98.2 F (36.8 C)] 97.4 F (36.3 C) (02/21 0433) Pulse Rate:  [105-116] 105 (02/21 0433) Resp:  [20] 20 (02/21 0433) BP: (91-114)/(50-58) 114/58 mmHg (02/21 0433) SpO2:  [91 %-97 %] 94 % (02/21 0433)  Intake/Output from previous day: 02/20 0701 - 02/21 0700 In: -  Out: 875 [Urine:875] Intake/Output this shift: Total I/O In: 60 [P.O.:60] Out: -    Recent Labs  03/29/13 0159 03/30/13 0630 03/31/13 0532  HGB 14.1 11.8* 9.6*    Recent Labs  03/30/13 0630 03/31/13 0532  WBC 10.0 9.0  RBC 3.72* 3.13*  HCT 34.2* 28.7*  PLT 190 150    Recent Labs  03/30/13 0630 03/31/13 0532  NA 134* 139  K 3.7 4.0  CL 97 104  CO2 20 21  BUN 8 11  CREATININE 0.68 0.70  GLUCOSE 85 127*  CALCIUM 8.1* 7.9*    Recent Labs  03/30/13 0630 03/31/13 0532  INR 1.12 1.89*    Incision: dressing C/D/I Compartment soft Bilateral heels without breakdown  Assessment/Plan: 2 Days Post-Op Procedure(s) (LRB): INTRAMEDULLARY (IM) RETROGRADE FEMORAL NAILING (Right) Float heels Dispo to SNF most likely early next week On Xarelto Eulalah Rupert 03/31/2013, 11:05 AM

## 2013-03-31 NOTE — Anesthesia Postprocedure Evaluation (Signed)
  Anesthesia Post-op Note  Patient: Lucas Harper  Procedure(s) Performed: Procedure(s) (LRB): INTRAMEDULLARY (IM) RETROGRADE FEMORAL NAILING (Right)  Patient Location: PACU  Anesthesia Type: General  Level of Consciousness: awake and alert   Airway and Oxygen Therapy: Patient Spontanous Breathing  Post-op Pain: mild  Post-op Assessment: Post-op Vital signs reviewed, Patient's Cardiovascular Status Stable, Respiratory Function Stable, Patent Airway and No signs of Nausea or vomiting  Last Vitals:  Filed Vitals:   03/31/13 1600  BP:   Pulse:   Temp:   Resp: 16    Post-op Vital Signs: stable   Complications: No apparent anesthesia complications

## 2013-03-31 NOTE — Progress Notes (Signed)
Gave bed offers to Pt's ex-wife at bedside.  Pt resting soundly.  RN present for conversation, as well.  Pt's ex-wife stated that if CIR doesn't work out, their first choice is [facility].  The family's fingers are crossed for CIR.   CSW thanked Pt's ex-wife for her time.  Weekday CSW to follow, if need be.  Providence CrosbyAmanda Valeriano Bain, LCSWA Clinical Social Work [phone]

## 2013-04-01 DIAGNOSIS — S72409A Unspecified fracture of lower end of unspecified femur, initial encounter for closed fracture: Secondary | ICD-10-CM | POA: Diagnosis not present

## 2013-04-01 LAB — CBC
HEMATOCRIT: 25.1 % — AB (ref 39.0–52.0)
Hemoglobin: 8.6 g/dL — ABNORMAL LOW (ref 13.0–17.0)
MCH: 30.8 pg (ref 26.0–34.0)
MCHC: 34.3 g/dL (ref 30.0–36.0)
MCV: 90 fL (ref 78.0–100.0)
Platelets: 157 10*3/uL (ref 150–400)
RBC: 2.79 MIL/uL — ABNORMAL LOW (ref 4.22–5.81)
RDW: 12.9 % (ref 11.5–15.5)
WBC: 7.1 10*3/uL (ref 4.0–10.5)

## 2013-04-01 LAB — PROTIME-INR
INR: 1.58 — AB (ref 0.00–1.49)
Prothrombin Time: 18.4 seconds — ABNORMAL HIGH (ref 11.6–15.2)

## 2013-04-01 MED ORDER — IRBESARTAN 75 MG PO TABS
75.0000 mg | ORAL_TABLET | Freq: Every day | ORAL | Status: DC
  Administered 2013-04-01 – 2013-04-03 (×3): 75 mg via ORAL
  Filled 2013-04-01 (×4): qty 1

## 2013-04-01 MED ORDER — OXYCODONE-ACETAMINOPHEN 5-325 MG PO TABS
1.0000 | ORAL_TABLET | ORAL | Status: DC | PRN
  Administered 2013-04-02: 2 via ORAL
  Administered 2013-04-02: 1 via ORAL
  Administered 2013-04-02 – 2013-04-04 (×10): 2 via ORAL
  Filled 2013-04-01 (×5): qty 2
  Filled 2013-04-01: qty 1
  Filled 2013-04-01 (×6): qty 2

## 2013-04-01 MED ORDER — TAMSULOSIN HCL 0.4 MG PO CAPS
0.4000 mg | ORAL_CAPSULE | Freq: Every day | ORAL | Status: DC
  Administered 2013-04-02 – 2013-04-04 (×3): 0.4 mg via ORAL
  Filled 2013-04-01 (×5): qty 1

## 2013-04-01 MED ORDER — FENTANYL CITRATE 0.05 MG/ML IJ SOLN
25.0000 ug | INTRAMUSCULAR | Status: DC | PRN
  Administered 2013-04-01 – 2013-04-02 (×6): 25 ug via INTRAVENOUS
  Filled 2013-04-01 (×6): qty 2
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
## 2013-04-01 NOTE — Progress Notes (Signed)
Foley cath removed yesterday around 1700. Pt does not have the urge to void and has tried several times and does not feel uncomfortable. Daughter and patient states he only voids a couple times normally.  Will cont to monitor patient and encourage fluids.

## 2013-04-01 NOTE — Progress Notes (Signed)
Called by Rn regarding   Hypotension Urine retention  Full note to follow  For now re-place foley,start low dose flomax, clamp folwey after voiding  cut back the Avarpro from 150-->75 Hold am dose coreg  Will see formally later today  Pleas KochJai Kahne Helfand, MD [facility] [phone]) [phone]

## 2013-04-01 NOTE — Progress Notes (Signed)
Subjective: 3 Days Post-Op Procedure(s) (LRB): INTRAMEDULLARY (IM) RETROGRADE FEMORAL NAILING (Right) Patient reports pain as mild.  Heels better today with application of heel boots.  Objective: Vital signs in last 24 hours: Temp:  [97.9 F (36.6 C)-99.9 F (37.7 C)] 97.9 F (36.6 C) (02/22 1438) Pulse Rate:  [96-97] 97 (02/22 1438) Resp:  [16-20] 20 (02/22 1600) BP: (92-125)/(43-66) 121/43 mmHg (02/22 1657) SpO2:  [4 %-100 %] 100 % (02/22 1600)  Intake/Output from previous day: 02/21 0701 - 02/22 0700 In: 1145 [P.O.:320; I.V.:825] Out: 550 [Urine:550] Intake/Output this shift: Total I/O In: 120 [P.O.:120] Out: -    Recent Labs  03/30/13 0630 03/31/13 0532 04/01/13 0425  HGB 11.8* 9.6* 8.6*    Recent Labs  03/31/13 0532 04/01/13 0425  WBC 9.0 7.1  RBC 3.13* 2.79*  HCT 28.7* 25.1*  PLT 150 157    Recent Labs  03/30/13 0630 03/31/13 0532  NA 134* 139  K 3.7 4.0  CL 97 104  CO2 20 21  BUN 8 11  CREATININE 0.68 0.70  GLUCOSE 85 127*  CALCIUM 8.1* 7.9*    Recent Labs  03/31/13 0532 04/01/13 0425  INR 1.89* 1.58*    Neurovascular intact Incision: scant drainage Compartment soft  Assessment/Plan: 3 Days Post-Op Procedure(s) (LRB): INTRAMEDULLARY (IM) RETROGRADE FEMORAL NAILING (Right) Continue current care  Dr. August Saucerean to return tomorrow  Lucas Harper, Lucas Harper 04/01/2013, 6:01 PM

## 2013-04-01 NOTE — Progress Notes (Signed)
Patient having problem swallowing pills does not want the pills crushed. Informed Dr. Mahala MenghiniSamtani, no new order given, will continue to assess patient.

## 2013-04-02 DIAGNOSIS — S72409A Unspecified fracture of lower end of unspecified femur, initial encounter for closed fracture: Secondary | ICD-10-CM

## 2013-04-02 DIAGNOSIS — G8110 Spastic hemiplegia affecting unspecified side: Secondary | ICD-10-CM

## 2013-04-02 LAB — COMPREHENSIVE METABOLIC PANEL
ALT: 10 U/L (ref 0–53)
AST: 21 U/L (ref 0–37)
Albumin: 2.2 g/dL — ABNORMAL LOW (ref 3.5–5.2)
Alkaline Phosphatase: 57 U/L (ref 39–117)
BUN: 10 mg/dL (ref 6–23)
CALCIUM: 8.2 mg/dL — AB (ref 8.4–10.5)
CO2: 23 meq/L (ref 19–32)
Chloride: 100 mEq/L (ref 96–112)
Creatinine, Ser: 0.56 mg/dL (ref 0.50–1.35)
GLUCOSE: 101 mg/dL — AB (ref 70–99)
Potassium: 3.7 mEq/L (ref 3.7–5.3)
Sodium: 136 mEq/L — ABNORMAL LOW (ref 137–147)
Total Bilirubin: 0.9 mg/dL (ref 0.3–1.2)
Total Protein: 5.4 g/dL — ABNORMAL LOW (ref 6.0–8.3)

## 2013-04-02 LAB — CBC
HCT: 26.7 % — ABNORMAL LOW (ref 39.0–52.0)
Hemoglobin: 9.1 g/dL — ABNORMAL LOW (ref 13.0–17.0)
MCH: 30.3 pg (ref 26.0–34.0)
MCHC: 34.1 g/dL (ref 30.0–36.0)
MCV: 89 fL (ref 78.0–100.0)
Platelets: 200 10*3/uL (ref 150–400)
RBC: 3 MIL/uL — AB (ref 4.22–5.81)
RDW: 12.7 % (ref 11.5–15.5)
WBC: 6.5 10*3/uL (ref 4.0–10.5)

## 2013-04-02 MED ORDER — DOCUSATE SODIUM 50 MG/5ML PO LIQD
100.0000 mg | Freq: Two times a day (BID) | ORAL | Status: DC
  Administered 2013-04-04: 100 mg via ORAL
  Filled 2013-04-02 (×6): qty 10

## 2013-04-02 MED ORDER — RIVAROXABAN 20 MG PO TABS: 20.0000 mg | ORAL_TABLET | Freq: Every day | ORAL | Status: DC

## 2013-04-02 MED ORDER — OXYCODONE-ACETAMINOPHEN 5-325 MG PO TABS: 1.0000 | ORAL_TABLET | ORAL | Status: DC | PRN

## 2013-04-02 MED ORDER — DOCUSATE SODIUM 50 MG/5ML PO LIQD: 100.0000 mg | Freq: Two times a day (BID) | ORAL | Status: DC

## 2013-04-02 NOTE — Progress Notes (Signed)
Discussed with RN CM. Slow recovery with rehab expected per Dr. Wynn BankerKirsteins, therefore SNF rehab is recommended at this time. [phone]
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
## 2013-04-02 NOTE — Progress Notes (Signed)
CSW reviewed CIR RN note stating that they are recommending SNF for patient rather than CIR. CSW spoke with patient & daughter, Lucas Harper at bedside re: SNF bed offers - she is planning to tour [facility] this afternoon & patient's exwife, Lucas Harper will tour tomorrow & will let CSW know of SNF decision.   Gn Clinical Social Work Department CLINICAL SOCIAL WORK PLACEMENT NOTE 04/02/2013  Patient:  Lucas Harper,Lucas Harper  Account Number:  [id number] Admit date:  03/28/2013  Clinical Social Worker:  Orpah GreekKELLY FOLEY, LCSWA  Date/time:  03/30/2013 02:06 PM  Clinical Social Work is seeking post-discharge placement for this patient at the following level of care:   SKILLED NURSING   (*CSW will update this form in Epic as items are completed)   03/30/2013  Patient/family provided with [facility] Department of Clinical Social Work's list of facilities offering this level of care within the geographic area requested by the patient (or if unable, by the patient's family).  03/30/2013  Patient/family informed of their freedom to choose among providers that offer the needed level of care, that participate in Medicare, Medicaid or managed care program needed by the patient, have an available bed and are willing to accept the patient.  03/30/2013  Patient/family informed of [facility]' ownership interest in [facility], as well as of the fact that they are under no obligation to receive care at this facility.  PASARR submitted to EDS on 03/30/2013 PASARR number received from EDS on 03/30/2013  FL2 transmitted to all facilities in geographic area requested by pt/family on  03/30/2013 FL2 transmitted to all facilities within larger geographic area on   Patient informed that his/her managed care company has contracts with or will negotiate with  certain facilities, including the following:     Patient/family informed of bed offers received:  04/02/2013 Patient chooses bed at  Physician recommends and  patient chooses bed at    Patient to be transferred to  on   Patient to be transferred to facility by   The following physician request were entered in Epic:   Additional Comments:   Lincoln MaxinKelly Erlin Gardella, LCSW [facility] Clinical Social Worker cell #: [phone]

## 2013-04-02 NOTE — Progress Notes (Signed)
Dc when bed available
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
## 2013-04-02 NOTE — Progress Notes (Signed)
Subjective: Pt stable - on oral pain meds with pain ok   Objective: Vital signs in last 24 hours: Temp:  [98.2 F (36.8 C)-100 F (37.8 C)] 98.2 F (36.8 C) (02/23 1343) Pulse Rate:  [84-104] 100 (02/23 1737) Resp:  [16-21] 18 (02/23 1600) BP: (106-132)/(53-84) 132/84 mmHg (02/23 1737) SpO2:  [95 %-98 %] 98 % (02/23 1600)  Intake/Output from previous day: 02/22 0701 - 02/23 0700 In: 360 [P.O.:360] Out: 625 [Urine:625] Intake/Output this shift: Total I/O In: 360 [P.O.:360] Out: 1050 [Urine:1050]  Exam:  Sensation intact distally Intact pulses distally Compartment soft  Labs:  Recent Labs  03/31/13 0532 04/01/13 0425 04/02/13 0510  HGB 9.6* 8.6* 9.1*    Recent Labs  04/01/13 0425 04/02/13 0510  WBC 7.1 6.5  RBC 2.79* 3.00*  HCT 25.1* 26.7*  PLT 157 200    Recent Labs  03/31/13 0532 04/02/13 0510  NA 139 136*  K 4.0 3.7  CL 104 100  CO2 21 23  BUN 11 10  CREATININE 0.70 0.56  GLUCOSE 127* 101*  CALCIUM 7.9* 8.2*    Recent Labs  03/31/13 0532 04/01/13 0425  INR 1.89* 1.58*    Assessment/Plan: Plan dc tues if bed available - hgb trending down - recheck am - thigh ok right Summary done rx on chart   DEAN,GREGORY SCOTT 04/02/2013, 6:46 PM

## 2013-04-03 MED ORDER — TRAMADOL HCL 50 MG PO TABS: 50.0000 mg | ORAL_TABLET | Freq: Four times a day (QID) | ORAL | Status: DC

## 2013-04-03 NOTE — Progress Notes (Signed)
[facility] will [facility] tomorrow, Wednesday 2/25.   Lincoln MaxinKelly Garik Diamant, LCSW [facility] Clinical Social Worker cell #: [phone]

## 2013-04-03 NOTE — Progress Notes (Signed)
Patient reviewed Pain suboptimally controlled Tolerating diet well without dysphagia No family at bedside today   Plan-add tramadol scheduled to when necessary Percocet Stable from medicine standpoint for discharge to facility when available  We will sign off and will not follow this patient subsequently thank you for the consult  Pleas KochJai Maraya Gwilliam, MD [facility] (P) [phone]
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
## 2013-04-03 NOTE — Progress Notes (Addendum)
Subjective: Pt stable - pain controlled   Objective: Vital signs in last 24 hours: Temp:  [97.7 F (36.5 C)-98.2 F (36.8 C)] 98.2 F (36.8 C) (02/24 1458) Pulse Rate:  [88-100] 92 (02/24 1458) Resp:  [16-20] 18 (02/24 1458) BP: (105-138)/(59-84) 138/74 mmHg (02/24 1458) SpO2:  [94 %-96 %] 96 % (02/24 1458)  Intake/Output from previous day: 02/23 0701 - 02/24 0700 In: 360 [P.O.:360] Out: 2675 [Urine:2675] Intake/Output this shift: Total I/O In: 240 [P.O.:240] Out: -   Exam:  Intact pulses distally Compartment soft  Labs:  Recent Labs  04/01/13 0425 04/02/13 0510  HGB 8.6* 9.1*    Recent Labs  04/01/13 0425 04/02/13 0510  WBC 7.1 6.5  RBC 2.79* 3.00*  HCT 25.1* 26.7*  PLT 157 200    Recent Labs  04/02/13 0510  NA 136*  K 3.7  CL 100  CO2 23  BUN 10  CREATININE 0.56  GLUCOSE 101*  CALCIUM 8.2*    Recent Labs  04/01/13 0425  INR 1.58*    Assessment/Plan: Plan dc to snf am - nwb rle - rx on chart -  - tramadol and perc for pain   Lucas Harper SCOTT 04/03/2013, 4:33 PM

## 2013-04-04 DIAGNOSIS — Z4889 Encounter for other specified surgical aftercare: Secondary | ICD-10-CM | POA: Diagnosis not present

## 2013-04-04 DIAGNOSIS — M25569 Pain in unspecified knee: Secondary | ICD-10-CM | POA: Diagnosis not present

## 2013-04-04 DIAGNOSIS — F3289 Other specified depressive episodes: Secondary | ICD-10-CM | POA: Diagnosis not present

## 2013-04-04 DIAGNOSIS — G8929 Other chronic pain: Secondary | ICD-10-CM | POA: Diagnosis not present

## 2013-04-04 DIAGNOSIS — S7290XD Unspecified fracture of unspecified femur, subsequent encounter for closed fracture with routine healing: Secondary | ICD-10-CM | POA: Diagnosis not present

## 2013-04-04 DIAGNOSIS — R1311 Dysphagia, oral phase: Secondary | ICD-10-CM | POA: Diagnosis not present

## 2013-04-04 DIAGNOSIS — R262 Difficulty in walking, not elsewhere classified: Secondary | ICD-10-CM | POA: Diagnosis not present

## 2013-04-04 DIAGNOSIS — G8190 Hemiplegia, unspecified affecting unspecified side: Secondary | ICD-10-CM | POA: Diagnosis not present

## 2013-04-04 DIAGNOSIS — S72309A Unspecified fracture of shaft of unspecified femur, initial encounter for closed fracture: Secondary | ICD-10-CM | POA: Diagnosis not present

## 2013-04-04 DIAGNOSIS — I4891 Unspecified atrial fibrillation: Secondary | ICD-10-CM | POA: Diagnosis not present

## 2013-04-04 DIAGNOSIS — J811 Chronic pulmonary edema: Secondary | ICD-10-CM | POA: Diagnosis not present

## 2013-04-04 DIAGNOSIS — K5900 Constipation, unspecified: Secondary | ICD-10-CM | POA: Diagnosis not present

## 2013-04-04 DIAGNOSIS — I10 Essential (primary) hypertension: Secondary | ICD-10-CM | POA: Diagnosis not present

## 2013-04-04 DIAGNOSIS — M6281 Muscle weakness (generalized): Secondary | ICD-10-CM | POA: Diagnosis not present

## 2013-04-04 DIAGNOSIS — S82009A Unspecified fracture of unspecified patella, initial encounter for closed fracture: Secondary | ICD-10-CM | POA: Diagnosis not present

## 2013-04-04 DIAGNOSIS — I2699 Other pulmonary embolism without acute cor pulmonale: Secondary | ICD-10-CM | POA: Diagnosis not present

## 2013-04-04 DIAGNOSIS — F329 Major depressive disorder, single episode, unspecified: Secondary | ICD-10-CM | POA: Diagnosis not present
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
## 2013-04-04 NOTE — Progress Notes (Signed)
Report called to Aram Beechamynthia, RN, at [facility]. RN made aware that there is a new order for overhead trapeze bar per T.O by Dr. August Saucerean.  Will continue to monitor patient until d/c.

## 2013-04-04 NOTE — Progress Notes (Signed)
[facility]. Patient & family at bedside aware. [facility] RN, KirwinHilary aware. [facility] scheduled for 2:00pm pickup (Service Request Id: [id number]).   Clinical Social Work Department CLINICAL SOCIAL WORK PLACEMENT NOTE 04/04/2013  Patient:  [name],Lucas H  Account Number:  [id number] Admit date:  03/28/2013  Clinical Social Worker:  Orpah GreekKELLY FOLEY, LCSWA  Date/time:  03/30/2013 02:06 PM  Clinical Social Work is seeking post-discharge placement for this patient at the following level of care:   SKILLED NURSING   (*CSW will update this form in Epic as items are completed)   03/30/2013  Patient/family provided with [facility] Department of Clinical Social Work's list of facilities offering this level of care within the geographic area requested by the patient (or if unable, by the patient's family).  03/30/2013  Patient/family informed of their freedom to choose among providers that offer the needed level of care, that participate in Medicare, Medicaid or managed care program needed by the patient, have an available bed and are willing to accept the patient.  03/30/2013  Patient/family informed of [facility]' ownership interest in [facility], as well as of the fact that they are under no obligation to receive care at this facility.  PASARR submitted to EDS on 03/30/2013 PASARR number received from EDS on 03/30/2013  FL2 transmitted to all facilities in geographic area requested by pt/family on  03/30/2013 FL2 transmitted to all facilities within larger geographic area on   Patient informed that his/her managed care company has contracts with or will negotiate with  certain facilities, including the following:     Patient/family informed of bed offers received:  04/02/2013 [facility] Physician recommends and patient chooses bed at    [facility] on  04/04/2013 Patient to be transferred to facility by PTAR  The following physician request were entered in Epic:   Additional Comments:   Lincoln MaxinKelly Gavriel Holzhauer, LCSW [facility] Clinical Social Worker cell #: [phone]

## 2013-04-06 DIAGNOSIS — I10 Essential (primary) hypertension: Secondary | ICD-10-CM | POA: Diagnosis not present

## 2013-04-06 DIAGNOSIS — J811 Chronic pulmonary edema: Secondary | ICD-10-CM | POA: Diagnosis not present

## 2013-04-06 DIAGNOSIS — I4891 Unspecified atrial fibrillation: Secondary | ICD-10-CM | POA: Diagnosis not present

## 2013-04-06 DIAGNOSIS — S82009A Unspecified fracture of unspecified patella, initial encounter for closed fracture: Secondary | ICD-10-CM | POA: Diagnosis not present

## 2013-04-07 DIAGNOSIS — J811 Chronic pulmonary edema: Secondary | ICD-10-CM | POA: Diagnosis not present

## 2013-04-07 DIAGNOSIS — I10 Essential (primary) hypertension: Secondary | ICD-10-CM | POA: Diagnosis not present

## 2013-04-07 DIAGNOSIS — I4891 Unspecified atrial fibrillation: Secondary | ICD-10-CM | POA: Diagnosis not present

## 2013-04-07 DIAGNOSIS — S82009A Unspecified fracture of unspecified patella, initial encounter for closed fracture: Secondary | ICD-10-CM | POA: Diagnosis not present

## 2013-04-10 DIAGNOSIS — J811 Chronic pulmonary edema: Secondary | ICD-10-CM | POA: Diagnosis not present

## 2013-04-10 DIAGNOSIS — I10 Essential (primary) hypertension: Secondary | ICD-10-CM | POA: Diagnosis not present

## 2013-04-10 DIAGNOSIS — S82009A Unspecified fracture of unspecified patella, initial encounter for closed fracture: Secondary | ICD-10-CM | POA: Diagnosis not present

## 2013-04-10 DIAGNOSIS — I4891 Unspecified atrial fibrillation: Secondary | ICD-10-CM | POA: Diagnosis not present

## 2013-04-18 DIAGNOSIS — S72309A Unspecified fracture of shaft of unspecified femur, initial encounter for closed fracture: Secondary | ICD-10-CM | POA: Diagnosis not present

## 2013-05-01 DIAGNOSIS — J811 Chronic pulmonary edema: Secondary | ICD-10-CM | POA: Diagnosis not present

## 2013-05-01 DIAGNOSIS — I10 Essential (primary) hypertension: Secondary | ICD-10-CM | POA: Diagnosis not present

## 2013-05-01 DIAGNOSIS — I4891 Unspecified atrial fibrillation: Secondary | ICD-10-CM | POA: Diagnosis not present

## 2013-05-01 DIAGNOSIS — S82009A Unspecified fracture of unspecified patella, initial encounter for closed fracture: Secondary | ICD-10-CM | POA: Diagnosis not present

## 2013-05-03 DIAGNOSIS — K5900 Constipation, unspecified: Secondary | ICD-10-CM | POA: Diagnosis not present

## 2013-05-03 DIAGNOSIS — I10 Essential (primary) hypertension: Secondary | ICD-10-CM | POA: Diagnosis not present

## 2013-05-03 DIAGNOSIS — S82009A Unspecified fracture of unspecified patella, initial encounter for closed fracture: Secondary | ICD-10-CM | POA: Diagnosis not present

## 2013-05-03 DIAGNOSIS — F3289 Other specified depressive episodes: Secondary | ICD-10-CM | POA: Diagnosis not present

## 2013-05-03 DIAGNOSIS — F329 Major depressive disorder, single episode, unspecified: Secondary | ICD-10-CM | POA: Diagnosis not present

## 2013-05-16 DIAGNOSIS — S72309A Unspecified fracture of shaft of unspecified femur, initial encounter for closed fracture: Secondary | ICD-10-CM | POA: Diagnosis not present

## 2013-06-07 DIAGNOSIS — I2699 Other pulmonary embolism without acute cor pulmonale: Secondary | ICD-10-CM | POA: Diagnosis not present

## 2013-06-07 DIAGNOSIS — G8250 Quadriplegia, unspecified: Secondary | ICD-10-CM | POA: Diagnosis not present

## 2013-06-07 DIAGNOSIS — Z7901 Long term (current) use of anticoagulants: Secondary | ICD-10-CM | POA: Diagnosis not present

## 2013-06-07 DIAGNOSIS — S7290XD Unspecified fracture of unspecified femur, subsequent encounter for closed fracture with routine healing: Secondary | ICD-10-CM | POA: Diagnosis not present

## 2013-06-07 DIAGNOSIS — I4891 Unspecified atrial fibrillation: Secondary | ICD-10-CM | POA: Diagnosis not present

## 2013-06-07 DIAGNOSIS — I10 Essential (primary) hypertension: Secondary | ICD-10-CM | POA: Diagnosis not present

## 2013-06-07 DIAGNOSIS — IMO0002 Reserved for concepts with insufficient information to code with codable children: Secondary | ICD-10-CM | POA: Diagnosis not present

## 2013-06-08 DIAGNOSIS — IMO0002 Reserved for concepts with insufficient information to code with codable children: Secondary | ICD-10-CM | POA: Diagnosis not present

## 2013-06-08 DIAGNOSIS — G8250 Quadriplegia, unspecified: Secondary | ICD-10-CM | POA: Diagnosis not present

## 2013-06-08 DIAGNOSIS — S7290XD Unspecified fracture of unspecified femur, subsequent encounter for closed fracture with routine healing: Secondary | ICD-10-CM | POA: Diagnosis not present

## 2013-06-08 DIAGNOSIS — I2699 Other pulmonary embolism without acute cor pulmonale: Secondary | ICD-10-CM | POA: Diagnosis not present

## 2013-06-08 DIAGNOSIS — I10 Essential (primary) hypertension: Secondary | ICD-10-CM | POA: Diagnosis not present

## 2013-06-08 DIAGNOSIS — I4891 Unspecified atrial fibrillation: Secondary | ICD-10-CM | POA: Diagnosis not present

## 2013-06-11 DIAGNOSIS — IMO0002 Reserved for concepts with insufficient information to code with codable children: Secondary | ICD-10-CM | POA: Diagnosis not present

## 2013-06-11 DIAGNOSIS — I2699 Other pulmonary embolism without acute cor pulmonale: Secondary | ICD-10-CM | POA: Diagnosis not present

## 2013-06-11 DIAGNOSIS — I4891 Unspecified atrial fibrillation: Secondary | ICD-10-CM | POA: Diagnosis not present

## 2013-06-11 DIAGNOSIS — S7290XD Unspecified fracture of unspecified femur, subsequent encounter for closed fracture with routine healing: Secondary | ICD-10-CM | POA: Diagnosis not present

## 2013-06-11 DIAGNOSIS — I10 Essential (primary) hypertension: Secondary | ICD-10-CM | POA: Diagnosis not present

## 2013-06-11 DIAGNOSIS — G8250 Quadriplegia, unspecified: Secondary | ICD-10-CM | POA: Diagnosis not present

## 2013-06-13 DIAGNOSIS — I4891 Unspecified atrial fibrillation: Secondary | ICD-10-CM | POA: Diagnosis not present

## 2013-06-13 DIAGNOSIS — I10 Essential (primary) hypertension: Secondary | ICD-10-CM | POA: Diagnosis not present

## 2013-06-13 DIAGNOSIS — S7290XD Unspecified fracture of unspecified femur, subsequent encounter for closed fracture with routine healing: Secondary | ICD-10-CM | POA: Diagnosis not present

## 2013-06-13 DIAGNOSIS — G8250 Quadriplegia, unspecified: Secondary | ICD-10-CM | POA: Diagnosis not present

## 2013-06-13 DIAGNOSIS — I2699 Other pulmonary embolism without acute cor pulmonale: Secondary | ICD-10-CM | POA: Diagnosis not present

## 2013-06-13 DIAGNOSIS — IMO0002 Reserved for concepts with insufficient information to code with codable children: Secondary | ICD-10-CM | POA: Diagnosis not present

## 2013-06-14 DIAGNOSIS — I10 Essential (primary) hypertension: Secondary | ICD-10-CM | POA: Diagnosis not present

## 2013-06-14 DIAGNOSIS — I4891 Unspecified atrial fibrillation: Secondary | ICD-10-CM | POA: Diagnosis not present

## 2013-06-14 DIAGNOSIS — IMO0002 Reserved for concepts with insufficient information to code with codable children: Secondary | ICD-10-CM | POA: Diagnosis not present

## 2013-06-14 DIAGNOSIS — I2699 Other pulmonary embolism without acute cor pulmonale: Secondary | ICD-10-CM | POA: Diagnosis not present

## 2013-06-14 DIAGNOSIS — S7290XD Unspecified fracture of unspecified femur, subsequent encounter for closed fracture with routine healing: Secondary | ICD-10-CM | POA: Diagnosis not present

## 2013-06-14 DIAGNOSIS — G8250 Quadriplegia, unspecified: Secondary | ICD-10-CM | POA: Diagnosis not present

## 2013-06-15 DIAGNOSIS — I2699 Other pulmonary embolism without acute cor pulmonale: Secondary | ICD-10-CM | POA: Diagnosis not present

## 2013-06-15 DIAGNOSIS — IMO0002 Reserved for concepts with insufficient information to code with codable children: Secondary | ICD-10-CM | POA: Diagnosis not present

## 2013-06-15 DIAGNOSIS — I10 Essential (primary) hypertension: Secondary | ICD-10-CM | POA: Diagnosis not present

## 2013-06-15 DIAGNOSIS — G8250 Quadriplegia, unspecified: Secondary | ICD-10-CM | POA: Diagnosis not present

## 2013-06-15 DIAGNOSIS — S7290XD Unspecified fracture of unspecified femur, subsequent encounter for closed fracture with routine healing: Secondary | ICD-10-CM | POA: Diagnosis not present

## 2013-06-15 DIAGNOSIS — I4891 Unspecified atrial fibrillation: Secondary | ICD-10-CM | POA: Diagnosis not present

## 2013-06-18 DIAGNOSIS — G8250 Quadriplegia, unspecified: Secondary | ICD-10-CM | POA: Diagnosis not present

## 2013-06-18 DIAGNOSIS — I10 Essential (primary) hypertension: Secondary | ICD-10-CM | POA: Diagnosis not present

## 2013-06-18 DIAGNOSIS — IMO0002 Reserved for concepts with insufficient information to code with codable children: Secondary | ICD-10-CM | POA: Diagnosis not present

## 2013-06-18 DIAGNOSIS — I2699 Other pulmonary embolism without acute cor pulmonale: Secondary | ICD-10-CM | POA: Diagnosis not present

## 2013-06-18 DIAGNOSIS — S7290XD Unspecified fracture of unspecified femur, subsequent encounter for closed fracture with routine healing: Secondary | ICD-10-CM | POA: Diagnosis not present

## 2013-06-18 DIAGNOSIS — I4891 Unspecified atrial fibrillation: Secondary | ICD-10-CM | POA: Diagnosis not present

## 2013-06-19 ENCOUNTER — Emergency Department (HOSPITAL_COMMUNITY): Payer: Medicare Other

## 2013-06-19 ENCOUNTER — Emergency Department (HOSPITAL_COMMUNITY)
Admission: EM | Admit: 2013-06-19 | Discharge: 2013-06-20 | Disposition: A | Discharge status: Home / Self Care | Payer: Medicare Other | Attending: Emergency Medicine | Admitting: Emergency Medicine

## 2013-06-19 ENCOUNTER — Encounter (HOSPITAL_COMMUNITY): Payer: Self-pay | Admitting: Emergency Medicine

## 2013-06-19 DIAGNOSIS — S7290XD Unspecified fracture of unspecified femur, subsequent encounter for closed fracture with routine healing: Secondary | ICD-10-CM | POA: Diagnosis not present

## 2013-06-19 DIAGNOSIS — Z7901 Long term (current) use of anticoagulants: Secondary | ICD-10-CM | POA: Insufficient documentation

## 2013-06-19 DIAGNOSIS — W230XXA Caught, crushed, jammed, or pinched between moving objects, initial encounter: Secondary | ICD-10-CM | POA: Diagnosis not present

## 2013-06-19 DIAGNOSIS — S81809A Unspecified open wound, unspecified lower leg, initial encounter: Secondary | ICD-10-CM | POA: Insufficient documentation

## 2013-06-19 DIAGNOSIS — M25559 Pain in unspecified hip: Secondary | ICD-10-CM | POA: Diagnosis not present

## 2013-06-19 DIAGNOSIS — S81812A Laceration without foreign body, left lower leg, initial encounter: Secondary | ICD-10-CM

## 2013-06-19 DIAGNOSIS — W268XXA Contact with other sharp object(s), not elsewhere classified, initial encounter: Secondary | ICD-10-CM | POA: Insufficient documentation

## 2013-06-19 DIAGNOSIS — Z8679 Personal history of other diseases of the circulatory system: Secondary | ICD-10-CM | POA: Insufficient documentation

## 2013-06-19 DIAGNOSIS — Y929 Unspecified place or not applicable: Secondary | ICD-10-CM | POA: Insufficient documentation

## 2013-06-19 DIAGNOSIS — Z87442 Personal history of urinary calculi: Secondary | ICD-10-CM | POA: Diagnosis not present

## 2013-06-19 DIAGNOSIS — S41009A Unspecified open wound of unspecified shoulder, initial encounter: Secondary | ICD-10-CM | POA: Diagnosis not present

## 2013-06-19 DIAGNOSIS — K219 Gastro-esophageal reflux disease without esophagitis: Secondary | ICD-10-CM | POA: Diagnosis not present

## 2013-06-19 DIAGNOSIS — I129 Hypertensive chronic kidney disease with stage 1 through stage 4 chronic kidney disease, or unspecified chronic kidney disease: Secondary | ICD-10-CM | POA: Insufficient documentation

## 2013-06-19 DIAGNOSIS — Z79899 Other long term (current) drug therapy: Secondary | ICD-10-CM | POA: Insufficient documentation

## 2013-06-19 DIAGNOSIS — Y9389 Activity, other specified: Secondary | ICD-10-CM | POA: Diagnosis not present

## 2013-06-19 DIAGNOSIS — I10 Essential (primary) hypertension: Secondary | ICD-10-CM | POA: Diagnosis not present

## 2013-06-19 DIAGNOSIS — E7800 Pure hypercholesterolemia, unspecified: Secondary | ICD-10-CM | POA: Diagnosis not present

## 2013-06-19 DIAGNOSIS — I2699 Other pulmonary embolism without acute cor pulmonale: Secondary | ICD-10-CM | POA: Diagnosis not present

## 2013-06-19 DIAGNOSIS — Z8669 Personal history of other diseases of the nervous system and sense organs: Secondary | ICD-10-CM | POA: Diagnosis not present

## 2013-06-19 DIAGNOSIS — E291 Testicular hypofunction: Secondary | ICD-10-CM | POA: Diagnosis not present

## 2013-06-19 DIAGNOSIS — S91009A Unspecified open wound, unspecified ankle, initial encounter: Secondary | ICD-10-CM | POA: Diagnosis not present

## 2013-06-19 DIAGNOSIS — Z8659 Personal history of other mental and behavioral disorders: Secondary | ICD-10-CM | POA: Insufficient documentation

## 2013-06-19 DIAGNOSIS — N189 Chronic kidney disease, unspecified: Secondary | ICD-10-CM | POA: Insufficient documentation

## 2013-06-19 DIAGNOSIS — IMO0002 Reserved for concepts with insufficient information to code with codable children: Secondary | ICD-10-CM | POA: Diagnosis not present

## 2013-06-19 DIAGNOSIS — G8250 Quadriplegia, unspecified: Secondary | ICD-10-CM | POA: Diagnosis not present

## 2013-06-19 DIAGNOSIS — I4891 Unspecified atrial fibrillation: Secondary | ICD-10-CM | POA: Diagnosis not present

## 2013-06-19 DIAGNOSIS — S81009A Unspecified open wound, unspecified knee, initial encounter: Secondary | ICD-10-CM | POA: Diagnosis not present

## 2013-06-19 MED ORDER — MORPHINE SULFATE 4 MG/ML IJ SOLN
6.0000 mg | Freq: Once | INTRAMUSCULAR | Status: AC
  Administered 2013-06-19: 6 mg via INTRAVENOUS
  Filled 2013-06-19: qty 2

## 2013-06-19 MED ORDER — MORPHINE SULFATE 4 MG/ML IJ SOLN
4.0000 mg | Freq: Once | INTRAMUSCULAR | Status: AC
  Administered 2013-06-19: 4 mg via INTRAVENOUS
  Filled 2013-06-19: qty 1

## 2013-06-19 NOTE — ED Notes (Signed)
Bed: WA03 Expected date: 06/19/13 Expected time: 8:43 PM Means of arrival: Ambulance Comments: Leg laceration

## 2013-06-19 NOTE — ED Notes (Signed)
Pt has L lower leg lac bandaged at this time.
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
## 2013-06-20 DIAGNOSIS — IMO0002 Reserved for concepts with insufficient information to code with codable children: Secondary | ICD-10-CM | POA: Diagnosis not present

## 2013-06-20 DIAGNOSIS — I4891 Unspecified atrial fibrillation: Secondary | ICD-10-CM | POA: Diagnosis not present

## 2013-06-20 DIAGNOSIS — S7290XD Unspecified fracture of unspecified femur, subsequent encounter for closed fracture with routine healing: Secondary | ICD-10-CM | POA: Diagnosis not present

## 2013-06-20 DIAGNOSIS — I2699 Other pulmonary embolism without acute cor pulmonale: Secondary | ICD-10-CM | POA: Diagnosis not present

## 2013-06-20 DIAGNOSIS — G8250 Quadriplegia, unspecified: Secondary | ICD-10-CM | POA: Diagnosis not present

## 2013-06-20 DIAGNOSIS — S81809A Unspecified open wound, unspecified lower leg, initial encounter: Secondary | ICD-10-CM | POA: Diagnosis not present

## 2013-06-20 DIAGNOSIS — I10 Essential (primary) hypertension: Secondary | ICD-10-CM | POA: Diagnosis not present

## 2013-06-20 MED ORDER — MORPHINE SULFATE 4 MG/ML IJ SOLN
6.0000 mg | Freq: Once | INTRAMUSCULAR | Status: AC
  Administered 2013-06-20: 6 mg via INTRAVENOUS
  Filled 2013-06-20: qty 2

## 2013-06-20 MED ORDER — TETANUS-DIPHTH-ACELL PERTUSSIS 5-2.5-18.5 LF-MCG/0.5 IM SUSP
0.5000 mL | Freq: Once | INTRAMUSCULAR | Status: AC
  Administered 2013-06-20: 0.5 mL via INTRAMUSCULAR
  Filled 2013-06-20: qty 0.5

## 2013-06-20 NOTE — Discharge Instructions (Signed)
Followup closely with his primary care doctor.  Return here for any signs of infection.  Keep area clean and dry.
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
## 2013-06-21 DIAGNOSIS — I10 Essential (primary) hypertension: Secondary | ICD-10-CM | POA: Diagnosis not present

## 2013-06-21 DIAGNOSIS — I4891 Unspecified atrial fibrillation: Secondary | ICD-10-CM | POA: Diagnosis not present

## 2013-06-21 DIAGNOSIS — I2699 Other pulmonary embolism without acute cor pulmonale: Secondary | ICD-10-CM | POA: Diagnosis not present

## 2013-06-21 DIAGNOSIS — S7290XD Unspecified fracture of unspecified femur, subsequent encounter for closed fracture with routine healing: Secondary | ICD-10-CM | POA: Diagnosis not present

## 2013-06-21 DIAGNOSIS — G8250 Quadriplegia, unspecified: Secondary | ICD-10-CM | POA: Diagnosis not present

## 2013-06-21 DIAGNOSIS — IMO0002 Reserved for concepts with insufficient information to code with codable children: Secondary | ICD-10-CM | POA: Diagnosis not present

## 2013-06-22 DIAGNOSIS — I10 Essential (primary) hypertension: Secondary | ICD-10-CM | POA: Diagnosis not present

## 2013-06-22 DIAGNOSIS — G8250 Quadriplegia, unspecified: Secondary | ICD-10-CM | POA: Diagnosis not present

## 2013-06-22 DIAGNOSIS — IMO0002 Reserved for concepts with insufficient information to code with codable children: Secondary | ICD-10-CM | POA: Diagnosis not present

## 2013-06-22 DIAGNOSIS — I4891 Unspecified atrial fibrillation: Secondary | ICD-10-CM | POA: Diagnosis not present

## 2013-06-22 DIAGNOSIS — S7290XD Unspecified fracture of unspecified femur, subsequent encounter for closed fracture with routine healing: Secondary | ICD-10-CM | POA: Diagnosis not present

## 2013-06-22 DIAGNOSIS — I2699 Other pulmonary embolism without acute cor pulmonale: Secondary | ICD-10-CM | POA: Diagnosis not present

## 2013-06-22 NOTE — ED Provider Notes (Signed)
Medical screening examination/treatment/procedure(s) were performed by non-physician practitioner and as supervising physician I was immediately available for consultation/collaboration.   EKG Interpretation None        Candyce ChurnJohn David Meliyah Simon III, MD 06/22/13 [phone]

## 2013-06-25 DIAGNOSIS — IMO0002 Reserved for concepts with insufficient information to code with codable children: Secondary | ICD-10-CM | POA: Diagnosis not present

## 2013-06-25 DIAGNOSIS — S7290XD Unspecified fracture of unspecified femur, subsequent encounter for closed fracture with routine healing: Secondary | ICD-10-CM | POA: Diagnosis not present

## 2013-06-25 DIAGNOSIS — I2699 Other pulmonary embolism without acute cor pulmonale: Secondary | ICD-10-CM | POA: Diagnosis not present

## 2013-06-25 DIAGNOSIS — I10 Essential (primary) hypertension: Secondary | ICD-10-CM | POA: Diagnosis not present

## 2013-06-25 DIAGNOSIS — G8250 Quadriplegia, unspecified: Secondary | ICD-10-CM | POA: Diagnosis not present

## 2013-06-25 DIAGNOSIS — I4891 Unspecified atrial fibrillation: Secondary | ICD-10-CM | POA: Diagnosis not present

## 2013-06-27 DIAGNOSIS — G8250 Quadriplegia, unspecified: Secondary | ICD-10-CM | POA: Diagnosis not present

## 2013-06-27 DIAGNOSIS — I2699 Other pulmonary embolism without acute cor pulmonale: Secondary | ICD-10-CM | POA: Diagnosis not present

## 2013-06-27 DIAGNOSIS — IMO0002 Reserved for concepts with insufficient information to code with codable children: Secondary | ICD-10-CM | POA: Diagnosis not present

## 2013-06-27 DIAGNOSIS — S81809A Unspecified open wound, unspecified lower leg, initial encounter: Secondary | ICD-10-CM | POA: Diagnosis not present

## 2013-06-27 DIAGNOSIS — I10 Essential (primary) hypertension: Secondary | ICD-10-CM | POA: Diagnosis not present

## 2013-06-27 DIAGNOSIS — I4891 Unspecified atrial fibrillation: Secondary | ICD-10-CM | POA: Diagnosis not present

## 2013-06-27 DIAGNOSIS — S7290XD Unspecified fracture of unspecified femur, subsequent encounter for closed fracture with routine healing: Secondary | ICD-10-CM | POA: Diagnosis not present

## 2013-06-28 DIAGNOSIS — I2699 Other pulmonary embolism without acute cor pulmonale: Secondary | ICD-10-CM | POA: Diagnosis not present

## 2013-06-28 DIAGNOSIS — IMO0002 Reserved for concepts with insufficient information to code with codable children: Secondary | ICD-10-CM | POA: Diagnosis not present

## 2013-06-28 DIAGNOSIS — G8250 Quadriplegia, unspecified: Secondary | ICD-10-CM | POA: Diagnosis not present

## 2013-06-28 DIAGNOSIS — I4891 Unspecified atrial fibrillation: Secondary | ICD-10-CM | POA: Diagnosis not present

## 2013-06-28 DIAGNOSIS — I10 Essential (primary) hypertension: Secondary | ICD-10-CM | POA: Diagnosis not present

## 2013-06-28 DIAGNOSIS — S7290XD Unspecified fracture of unspecified femur, subsequent encounter for closed fracture with routine healing: Secondary | ICD-10-CM | POA: Diagnosis not present

## 2013-06-29 DIAGNOSIS — I10 Essential (primary) hypertension: Secondary | ICD-10-CM | POA: Diagnosis not present

## 2013-06-29 DIAGNOSIS — S81809A Unspecified open wound, unspecified lower leg, initial encounter: Secondary | ICD-10-CM | POA: Diagnosis not present

## 2013-06-29 DIAGNOSIS — G8250 Quadriplegia, unspecified: Secondary | ICD-10-CM | POA: Diagnosis not present

## 2013-06-29 DIAGNOSIS — IMO0002 Reserved for concepts with insufficient information to code with codable children: Secondary | ICD-10-CM | POA: Diagnosis not present

## 2013-06-29 DIAGNOSIS — I2699 Other pulmonary embolism without acute cor pulmonale: Secondary | ICD-10-CM | POA: Diagnosis not present

## 2013-06-29 DIAGNOSIS — Z5189 Encounter for other specified aftercare: Secondary | ICD-10-CM | POA: Diagnosis not present

## 2013-06-29 DIAGNOSIS — S7290XD Unspecified fracture of unspecified femur, subsequent encounter for closed fracture with routine healing: Secondary | ICD-10-CM | POA: Diagnosis not present

## 2013-06-29 DIAGNOSIS — I4891 Unspecified atrial fibrillation: Secondary | ICD-10-CM | POA: Diagnosis not present

## 2013-07-03 DIAGNOSIS — S81809A Unspecified open wound, unspecified lower leg, initial encounter: Secondary | ICD-10-CM | POA: Diagnosis not present

## 2013-07-03 DIAGNOSIS — Z5189 Encounter for other specified aftercare: Secondary | ICD-10-CM | POA: Diagnosis not present

## 2013-07-06 DIAGNOSIS — I2699 Other pulmonary embolism without acute cor pulmonale: Secondary | ICD-10-CM | POA: Diagnosis not present

## 2013-07-06 DIAGNOSIS — G8250 Quadriplegia, unspecified: Secondary | ICD-10-CM | POA: Diagnosis not present

## 2013-07-06 DIAGNOSIS — IMO0002 Reserved for concepts with insufficient information to code with codable children: Secondary | ICD-10-CM | POA: Diagnosis not present

## 2013-07-06 DIAGNOSIS — I4891 Unspecified atrial fibrillation: Secondary | ICD-10-CM | POA: Diagnosis not present

## 2013-07-06 DIAGNOSIS — I10 Essential (primary) hypertension: Secondary | ICD-10-CM | POA: Diagnosis not present

## 2013-07-06 DIAGNOSIS — S7290XD Unspecified fracture of unspecified femur, subsequent encounter for closed fracture with routine healing: Secondary | ICD-10-CM | POA: Diagnosis not present

## 2013-07-10 DIAGNOSIS — S7290XD Unspecified fracture of unspecified femur, subsequent encounter for closed fracture with routine healing: Secondary | ICD-10-CM | POA: Diagnosis not present

## 2013-07-10 DIAGNOSIS — I4891 Unspecified atrial fibrillation: Secondary | ICD-10-CM | POA: Diagnosis not present

## 2013-07-10 DIAGNOSIS — IMO0002 Reserved for concepts with insufficient information to code with codable children: Secondary | ICD-10-CM | POA: Diagnosis not present

## 2013-07-10 DIAGNOSIS — I10 Essential (primary) hypertension: Secondary | ICD-10-CM | POA: Diagnosis not present

## 2013-07-10 DIAGNOSIS — I2699 Other pulmonary embolism without acute cor pulmonale: Secondary | ICD-10-CM | POA: Diagnosis not present

## 2013-07-10 DIAGNOSIS — G8250 Quadriplegia, unspecified: Secondary | ICD-10-CM | POA: Diagnosis not present

## 2013-07-12 DIAGNOSIS — G8250 Quadriplegia, unspecified: Secondary | ICD-10-CM | POA: Diagnosis not present

## 2013-07-12 DIAGNOSIS — I4891 Unspecified atrial fibrillation: Secondary | ICD-10-CM | POA: Diagnosis not present

## 2013-07-12 DIAGNOSIS — IMO0002 Reserved for concepts with insufficient information to code with codable children: Secondary | ICD-10-CM | POA: Diagnosis not present

## 2013-07-12 DIAGNOSIS — I10 Essential (primary) hypertension: Secondary | ICD-10-CM | POA: Diagnosis not present

## 2013-07-12 DIAGNOSIS — I2699 Other pulmonary embolism without acute cor pulmonale: Secondary | ICD-10-CM | POA: Diagnosis not present

## 2013-07-12 DIAGNOSIS — S7290XD Unspecified fracture of unspecified femur, subsequent encounter for closed fracture with routine healing: Secondary | ICD-10-CM | POA: Diagnosis not present

## 2013-07-13 DIAGNOSIS — G8250 Quadriplegia, unspecified: Secondary | ICD-10-CM | POA: Diagnosis not present

## 2013-07-13 DIAGNOSIS — I10 Essential (primary) hypertension: Secondary | ICD-10-CM | POA: Diagnosis not present

## 2013-07-13 DIAGNOSIS — I4891 Unspecified atrial fibrillation: Secondary | ICD-10-CM | POA: Diagnosis not present

## 2013-07-13 DIAGNOSIS — S7290XD Unspecified fracture of unspecified femur, subsequent encounter for closed fracture with routine healing: Secondary | ICD-10-CM | POA: Diagnosis not present

## 2013-07-13 DIAGNOSIS — IMO0002 Reserved for concepts with insufficient information to code with codable children: Secondary | ICD-10-CM | POA: Diagnosis not present

## 2013-07-13 DIAGNOSIS — I2699 Other pulmonary embolism without acute cor pulmonale: Secondary | ICD-10-CM | POA: Diagnosis not present

## 2013-07-18 DIAGNOSIS — G8250 Quadriplegia, unspecified: Secondary | ICD-10-CM | POA: Diagnosis not present

## 2013-07-18 DIAGNOSIS — I10 Essential (primary) hypertension: Secondary | ICD-10-CM | POA: Diagnosis not present

## 2013-07-18 DIAGNOSIS — I2699 Other pulmonary embolism without acute cor pulmonale: Secondary | ICD-10-CM | POA: Diagnosis not present

## 2013-07-18 DIAGNOSIS — S7290XD Unspecified fracture of unspecified femur, subsequent encounter for closed fracture with routine healing: Secondary | ICD-10-CM | POA: Diagnosis not present

## 2013-07-18 DIAGNOSIS — I4891 Unspecified atrial fibrillation: Secondary | ICD-10-CM | POA: Diagnosis not present

## 2013-07-18 DIAGNOSIS — IMO0002 Reserved for concepts with insufficient information to code with codable children: Secondary | ICD-10-CM | POA: Diagnosis not present

## 2013-07-19 DIAGNOSIS — S7290XD Unspecified fracture of unspecified femur, subsequent encounter for closed fracture with routine healing: Secondary | ICD-10-CM | POA: Diagnosis not present

## 2013-07-19 DIAGNOSIS — IMO0002 Reserved for concepts with insufficient information to code with codable children: Secondary | ICD-10-CM | POA: Diagnosis not present

## 2013-07-19 DIAGNOSIS — I10 Essential (primary) hypertension: Secondary | ICD-10-CM | POA: Diagnosis not present

## 2013-07-19 DIAGNOSIS — G8250 Quadriplegia, unspecified: Secondary | ICD-10-CM | POA: Diagnosis not present

## 2013-07-19 DIAGNOSIS — I2699 Other pulmonary embolism without acute cor pulmonale: Secondary | ICD-10-CM | POA: Diagnosis not present

## 2013-07-19 DIAGNOSIS — I4891 Unspecified atrial fibrillation: Secondary | ICD-10-CM | POA: Diagnosis not present

## 2013-07-20 DIAGNOSIS — I10 Essential (primary) hypertension: Secondary | ICD-10-CM | POA: Diagnosis not present

## 2013-07-20 DIAGNOSIS — I4891 Unspecified atrial fibrillation: Secondary | ICD-10-CM | POA: Diagnosis not present

## 2013-07-20 DIAGNOSIS — G8250 Quadriplegia, unspecified: Secondary | ICD-10-CM | POA: Diagnosis not present

## 2013-07-20 DIAGNOSIS — IMO0002 Reserved for concepts with insufficient information to code with codable children: Secondary | ICD-10-CM | POA: Diagnosis not present

## 2013-07-20 DIAGNOSIS — S7290XD Unspecified fracture of unspecified femur, subsequent encounter for closed fracture with routine healing: Secondary | ICD-10-CM | POA: Diagnosis not present

## 2013-07-20 DIAGNOSIS — I2699 Other pulmonary embolism without acute cor pulmonale: Secondary | ICD-10-CM | POA: Diagnosis not present

## 2013-07-24 DIAGNOSIS — S7290XD Unspecified fracture of unspecified femur, subsequent encounter for closed fracture with routine healing: Secondary | ICD-10-CM | POA: Diagnosis not present

## 2013-07-24 DIAGNOSIS — G8250 Quadriplegia, unspecified: Secondary | ICD-10-CM | POA: Diagnosis not present

## 2013-07-24 DIAGNOSIS — IMO0002 Reserved for concepts with insufficient information to code with codable children: Secondary | ICD-10-CM | POA: Diagnosis not present

## 2013-07-24 DIAGNOSIS — I4891 Unspecified atrial fibrillation: Secondary | ICD-10-CM | POA: Diagnosis not present

## 2013-07-24 DIAGNOSIS — I2699 Other pulmonary embolism without acute cor pulmonale: Secondary | ICD-10-CM | POA: Diagnosis not present

## 2013-07-24 DIAGNOSIS — I10 Essential (primary) hypertension: Secondary | ICD-10-CM | POA: Diagnosis not present

## 2013-07-26 DIAGNOSIS — S7290XD Unspecified fracture of unspecified femur, subsequent encounter for closed fracture with routine healing: Secondary | ICD-10-CM | POA: Diagnosis not present

## 2013-07-26 DIAGNOSIS — I2699 Other pulmonary embolism without acute cor pulmonale: Secondary | ICD-10-CM | POA: Diagnosis not present

## 2013-07-26 DIAGNOSIS — G8250 Quadriplegia, unspecified: Secondary | ICD-10-CM | POA: Diagnosis not present

## 2013-07-26 DIAGNOSIS — I4891 Unspecified atrial fibrillation: Secondary | ICD-10-CM | POA: Diagnosis not present

## 2013-07-26 DIAGNOSIS — I10 Essential (primary) hypertension: Secondary | ICD-10-CM | POA: Diagnosis not present

## 2013-07-26 DIAGNOSIS — IMO0002 Reserved for concepts with insufficient information to code with codable children: Secondary | ICD-10-CM | POA: Diagnosis not present

## 2013-08-13 DIAGNOSIS — G8250 Quadriplegia, unspecified: Secondary | ICD-10-CM | POA: Diagnosis not present

## 2013-08-13 DIAGNOSIS — I4891 Unspecified atrial fibrillation: Secondary | ICD-10-CM | POA: Diagnosis not present

## 2013-08-13 DIAGNOSIS — Z5189 Encounter for other specified aftercare: Secondary | ICD-10-CM | POA: Diagnosis not present

## 2013-08-13 DIAGNOSIS — IMO0002 Reserved for concepts with insufficient information to code with codable children: Secondary | ICD-10-CM | POA: Diagnosis not present

## 2013-08-13 DIAGNOSIS — S7290XD Unspecified fracture of unspecified femur, subsequent encounter for closed fracture with routine healing: Secondary | ICD-10-CM | POA: Diagnosis not present

## 2013-08-13 DIAGNOSIS — Z7901 Long term (current) use of anticoagulants: Secondary | ICD-10-CM | POA: Diagnosis not present

## 2013-08-13 DIAGNOSIS — I2699 Other pulmonary embolism without acute cor pulmonale: Secondary | ICD-10-CM | POA: Diagnosis not present

## 2013-08-13 DIAGNOSIS — I10 Essential (primary) hypertension: Secondary | ICD-10-CM | POA: Diagnosis not present

## 2013-08-21 DIAGNOSIS — K219 Gastro-esophageal reflux disease without esophagitis: Secondary | ICD-10-CM | POA: Diagnosis not present

## 2013-08-21 DIAGNOSIS — I10 Essential (primary) hypertension: Secondary | ICD-10-CM | POA: Diagnosis not present

## 2013-08-21 DIAGNOSIS — E7800 Pure hypercholesterolemia, unspecified: Secondary | ICD-10-CM | POA: Diagnosis not present

## 2013-08-21 DIAGNOSIS — M25559 Pain in unspecified hip: Secondary | ICD-10-CM | POA: Diagnosis not present

## 2013-08-21 DIAGNOSIS — I2699 Other pulmonary embolism without acute cor pulmonale: Secondary | ICD-10-CM | POA: Diagnosis not present

## 2013-08-22 DIAGNOSIS — G8250 Quadriplegia, unspecified: Secondary | ICD-10-CM | POA: Diagnosis not present

## 2013-08-22 DIAGNOSIS — I4891 Unspecified atrial fibrillation: Secondary | ICD-10-CM | POA: Diagnosis not present

## 2013-08-22 DIAGNOSIS — IMO0002 Reserved for concepts with insufficient information to code with codable children: Secondary | ICD-10-CM | POA: Diagnosis not present

## 2013-08-22 DIAGNOSIS — S7290XD Unspecified fracture of unspecified femur, subsequent encounter for closed fracture with routine healing: Secondary | ICD-10-CM | POA: Diagnosis not present

## 2013-08-22 DIAGNOSIS — Z5189 Encounter for other specified aftercare: Secondary | ICD-10-CM | POA: Diagnosis not present

## 2013-08-22 DIAGNOSIS — I2699 Other pulmonary embolism without acute cor pulmonale: Secondary | ICD-10-CM | POA: Diagnosis not present

## 2013-08-24 DIAGNOSIS — Z5189 Encounter for other specified aftercare: Secondary | ICD-10-CM | POA: Diagnosis not present

## 2013-08-24 DIAGNOSIS — IMO0002 Reserved for concepts with insufficient information to code with codable children: Secondary | ICD-10-CM | POA: Diagnosis not present

## 2013-08-24 DIAGNOSIS — I2699 Other pulmonary embolism without acute cor pulmonale: Secondary | ICD-10-CM | POA: Diagnosis not present

## 2013-08-24 DIAGNOSIS — G8250 Quadriplegia, unspecified: Secondary | ICD-10-CM | POA: Diagnosis not present

## 2013-08-24 DIAGNOSIS — S7290XD Unspecified fracture of unspecified femur, subsequent encounter for closed fracture with routine healing: Secondary | ICD-10-CM | POA: Diagnosis not present

## 2013-08-24 DIAGNOSIS — I4891 Unspecified atrial fibrillation: Secondary | ICD-10-CM | POA: Diagnosis not present

## 2013-08-27 DIAGNOSIS — IMO0002 Reserved for concepts with insufficient information to code with codable children: Secondary | ICD-10-CM | POA: Diagnosis not present

## 2013-08-27 DIAGNOSIS — I2699 Other pulmonary embolism without acute cor pulmonale: Secondary | ICD-10-CM | POA: Diagnosis not present

## 2013-08-27 DIAGNOSIS — I4891 Unspecified atrial fibrillation: Secondary | ICD-10-CM | POA: Diagnosis not present

## 2013-08-27 DIAGNOSIS — Z5189 Encounter for other specified aftercare: Secondary | ICD-10-CM | POA: Diagnosis not present

## 2013-08-27 DIAGNOSIS — S7290XD Unspecified fracture of unspecified femur, subsequent encounter for closed fracture with routine healing: Secondary | ICD-10-CM | POA: Diagnosis not present

## 2013-08-27 DIAGNOSIS — G8250 Quadriplegia, unspecified: Secondary | ICD-10-CM | POA: Diagnosis not present

## 2013-08-29 DIAGNOSIS — Z5189 Encounter for other specified aftercare: Secondary | ICD-10-CM | POA: Diagnosis not present

## 2013-08-29 DIAGNOSIS — IMO0002 Reserved for concepts with insufficient information to code with codable children: Secondary | ICD-10-CM | POA: Diagnosis not present

## 2013-08-29 DIAGNOSIS — I4891 Unspecified atrial fibrillation: Secondary | ICD-10-CM | POA: Diagnosis not present

## 2013-08-29 DIAGNOSIS — S7290XD Unspecified fracture of unspecified femur, subsequent encounter for closed fracture with routine healing: Secondary | ICD-10-CM | POA: Diagnosis not present

## 2013-08-29 DIAGNOSIS — I2699 Other pulmonary embolism without acute cor pulmonale: Secondary | ICD-10-CM | POA: Diagnosis not present

## 2013-08-29 DIAGNOSIS — G8250 Quadriplegia, unspecified: Secondary | ICD-10-CM | POA: Diagnosis not present

## 2013-09-03 DIAGNOSIS — I4891 Unspecified atrial fibrillation: Secondary | ICD-10-CM | POA: Diagnosis not present

## 2013-09-03 DIAGNOSIS — Z5189 Encounter for other specified aftercare: Secondary | ICD-10-CM | POA: Diagnosis not present

## 2013-09-03 DIAGNOSIS — I2699 Other pulmonary embolism without acute cor pulmonale: Secondary | ICD-10-CM | POA: Diagnosis not present

## 2013-09-03 DIAGNOSIS — S7290XD Unspecified fracture of unspecified femur, subsequent encounter for closed fracture with routine healing: Secondary | ICD-10-CM | POA: Diagnosis not present

## 2013-09-03 DIAGNOSIS — IMO0002 Reserved for concepts with insufficient information to code with codable children: Secondary | ICD-10-CM | POA: Diagnosis not present

## 2013-09-03 DIAGNOSIS — G8250 Quadriplegia, unspecified: Secondary | ICD-10-CM | POA: Diagnosis not present

## 2013-09-06 DIAGNOSIS — I2699 Other pulmonary embolism without acute cor pulmonale: Secondary | ICD-10-CM | POA: Diagnosis not present

## 2013-09-06 DIAGNOSIS — I4891 Unspecified atrial fibrillation: Secondary | ICD-10-CM | POA: Diagnosis not present

## 2013-09-06 DIAGNOSIS — Z5189 Encounter for other specified aftercare: Secondary | ICD-10-CM | POA: Diagnosis not present

## 2013-09-06 DIAGNOSIS — IMO0002 Reserved for concepts with insufficient information to code with codable children: Secondary | ICD-10-CM | POA: Diagnosis not present

## 2013-09-06 DIAGNOSIS — G8250 Quadriplegia, unspecified: Secondary | ICD-10-CM | POA: Diagnosis not present

## 2013-09-06 DIAGNOSIS — S7290XD Unspecified fracture of unspecified femur, subsequent encounter for closed fracture with routine healing: Secondary | ICD-10-CM | POA: Diagnosis not present

## 2013-09-11 DIAGNOSIS — Z5189 Encounter for other specified aftercare: Secondary | ICD-10-CM | POA: Diagnosis not present

## 2013-09-11 DIAGNOSIS — IMO0002 Reserved for concepts with insufficient information to code with codable children: Secondary | ICD-10-CM | POA: Diagnosis not present

## 2013-09-11 DIAGNOSIS — I2699 Other pulmonary embolism without acute cor pulmonale: Secondary | ICD-10-CM | POA: Diagnosis not present

## 2013-09-11 DIAGNOSIS — I4891 Unspecified atrial fibrillation: Secondary | ICD-10-CM | POA: Diagnosis not present

## 2013-09-11 DIAGNOSIS — G8250 Quadriplegia, unspecified: Secondary | ICD-10-CM | POA: Diagnosis not present

## 2013-09-11 DIAGNOSIS — S7290XD Unspecified fracture of unspecified femur, subsequent encounter for closed fracture with routine healing: Secondary | ICD-10-CM | POA: Diagnosis not present

## 2013-09-14 DIAGNOSIS — Z5189 Encounter for other specified aftercare: Secondary | ICD-10-CM | POA: Diagnosis not present

## 2013-09-14 DIAGNOSIS — I4891 Unspecified atrial fibrillation: Secondary | ICD-10-CM | POA: Diagnosis not present

## 2013-09-14 DIAGNOSIS — S7290XD Unspecified fracture of unspecified femur, subsequent encounter for closed fracture with routine healing: Secondary | ICD-10-CM | POA: Diagnosis not present

## 2013-09-14 DIAGNOSIS — IMO0002 Reserved for concepts with insufficient information to code with codable children: Secondary | ICD-10-CM | POA: Diagnosis not present

## 2013-09-14 DIAGNOSIS — I2699 Other pulmonary embolism without acute cor pulmonale: Secondary | ICD-10-CM | POA: Diagnosis not present

## 2013-09-14 DIAGNOSIS — G8250 Quadriplegia, unspecified: Secondary | ICD-10-CM | POA: Diagnosis not present

## 2013-09-17 DIAGNOSIS — I2699 Other pulmonary embolism without acute cor pulmonale: Secondary | ICD-10-CM | POA: Diagnosis not present

## 2013-09-17 DIAGNOSIS — I4891 Unspecified atrial fibrillation: Secondary | ICD-10-CM | POA: Diagnosis not present

## 2013-09-17 DIAGNOSIS — G8250 Quadriplegia, unspecified: Secondary | ICD-10-CM | POA: Diagnosis not present

## 2013-09-17 DIAGNOSIS — S7290XD Unspecified fracture of unspecified femur, subsequent encounter for closed fracture with routine healing: Secondary | ICD-10-CM | POA: Diagnosis not present

## 2013-09-17 DIAGNOSIS — Z5189 Encounter for other specified aftercare: Secondary | ICD-10-CM | POA: Diagnosis not present

## 2013-09-17 DIAGNOSIS — IMO0002 Reserved for concepts with insufficient information to code with codable children: Secondary | ICD-10-CM | POA: Diagnosis not present

## 2013-09-19 DIAGNOSIS — I2699 Other pulmonary embolism without acute cor pulmonale: Secondary | ICD-10-CM | POA: Diagnosis not present

## 2013-09-19 DIAGNOSIS — S7290XD Unspecified fracture of unspecified femur, subsequent encounter for closed fracture with routine healing: Secondary | ICD-10-CM | POA: Diagnosis not present

## 2013-09-19 DIAGNOSIS — IMO0002 Reserved for concepts with insufficient information to code with codable children: Secondary | ICD-10-CM | POA: Diagnosis not present

## 2013-09-19 DIAGNOSIS — Z5189 Encounter for other specified aftercare: Secondary | ICD-10-CM | POA: Diagnosis not present

## 2013-09-19 DIAGNOSIS — G8250 Quadriplegia, unspecified: Secondary | ICD-10-CM | POA: Diagnosis not present

## 2013-09-19 DIAGNOSIS — I4891 Unspecified atrial fibrillation: Secondary | ICD-10-CM | POA: Diagnosis not present

## 2013-09-24 DIAGNOSIS — G8250 Quadriplegia, unspecified: Secondary | ICD-10-CM | POA: Diagnosis not present

## 2013-09-24 DIAGNOSIS — Z5189 Encounter for other specified aftercare: Secondary | ICD-10-CM | POA: Diagnosis not present

## 2013-09-24 DIAGNOSIS — S7290XD Unspecified fracture of unspecified femur, subsequent encounter for closed fracture with routine healing: Secondary | ICD-10-CM | POA: Diagnosis not present

## 2013-09-24 DIAGNOSIS — I2699 Other pulmonary embolism without acute cor pulmonale: Secondary | ICD-10-CM | POA: Diagnosis not present

## 2013-09-24 DIAGNOSIS — IMO0002 Reserved for concepts with insufficient information to code with codable children: Secondary | ICD-10-CM | POA: Diagnosis not present

## 2013-09-24 DIAGNOSIS — I4891 Unspecified atrial fibrillation: Secondary | ICD-10-CM | POA: Diagnosis not present

## 2013-09-27 DIAGNOSIS — I2699 Other pulmonary embolism without acute cor pulmonale: Secondary | ICD-10-CM | POA: Diagnosis not present

## 2013-09-27 DIAGNOSIS — I4891 Unspecified atrial fibrillation: Secondary | ICD-10-CM | POA: Diagnosis not present

## 2013-09-27 DIAGNOSIS — Z5189 Encounter for other specified aftercare: Secondary | ICD-10-CM | POA: Diagnosis not present

## 2013-09-27 DIAGNOSIS — G8250 Quadriplegia, unspecified: Secondary | ICD-10-CM | POA: Diagnosis not present

## 2013-09-27 DIAGNOSIS — S72309A Unspecified fracture of shaft of unspecified femur, initial encounter for closed fracture: Secondary | ICD-10-CM | POA: Diagnosis not present

## 2013-09-27 DIAGNOSIS — S7290XD Unspecified fracture of unspecified femur, subsequent encounter for closed fracture with routine healing: Secondary | ICD-10-CM | POA: Diagnosis not present

## 2013-09-27 DIAGNOSIS — IMO0002 Reserved for concepts with insufficient information to code with codable children: Secondary | ICD-10-CM | POA: Diagnosis not present

## 2013-10-03 DIAGNOSIS — I2699 Other pulmonary embolism without acute cor pulmonale: Secondary | ICD-10-CM | POA: Diagnosis not present

## 2013-10-03 DIAGNOSIS — G8250 Quadriplegia, unspecified: Secondary | ICD-10-CM | POA: Diagnosis not present

## 2013-10-03 DIAGNOSIS — S7290XD Unspecified fracture of unspecified femur, subsequent encounter for closed fracture with routine healing: Secondary | ICD-10-CM | POA: Diagnosis not present

## 2013-10-03 DIAGNOSIS — I4891 Unspecified atrial fibrillation: Secondary | ICD-10-CM | POA: Diagnosis not present

## 2013-10-03 DIAGNOSIS — IMO0002 Reserved for concepts with insufficient information to code with codable children: Secondary | ICD-10-CM | POA: Diagnosis not present

## 2013-10-03 DIAGNOSIS — Z5189 Encounter for other specified aftercare: Secondary | ICD-10-CM | POA: Diagnosis not present

## 2013-10-08 DIAGNOSIS — G8250 Quadriplegia, unspecified: Secondary | ICD-10-CM | POA: Diagnosis not present

## 2013-10-08 DIAGNOSIS — I4891 Unspecified atrial fibrillation: Secondary | ICD-10-CM | POA: Diagnosis not present

## 2013-10-08 DIAGNOSIS — I2699 Other pulmonary embolism without acute cor pulmonale: Secondary | ICD-10-CM | POA: Diagnosis not present

## 2013-10-08 DIAGNOSIS — Z5189 Encounter for other specified aftercare: Secondary | ICD-10-CM | POA: Diagnosis not present

## 2013-10-08 DIAGNOSIS — IMO0002 Reserved for concepts with insufficient information to code with codable children: Secondary | ICD-10-CM | POA: Diagnosis not present

## 2013-10-08 DIAGNOSIS — S7290XD Unspecified fracture of unspecified femur, subsequent encounter for closed fracture with routine healing: Secondary | ICD-10-CM | POA: Diagnosis not present

## 2013-10-11 DIAGNOSIS — S7290XD Unspecified fracture of unspecified femur, subsequent encounter for closed fracture with routine healing: Secondary | ICD-10-CM | POA: Diagnosis not present

## 2013-10-11 DIAGNOSIS — Z5189 Encounter for other specified aftercare: Secondary | ICD-10-CM | POA: Diagnosis not present

## 2013-10-11 DIAGNOSIS — I2699 Other pulmonary embolism without acute cor pulmonale: Secondary | ICD-10-CM | POA: Diagnosis not present

## 2013-10-11 DIAGNOSIS — I4891 Unspecified atrial fibrillation: Secondary | ICD-10-CM | POA: Diagnosis not present

## 2013-10-11 DIAGNOSIS — IMO0002 Reserved for concepts with insufficient information to code with codable children: Secondary | ICD-10-CM | POA: Diagnosis not present

## 2013-10-11 DIAGNOSIS — G8250 Quadriplegia, unspecified: Secondary | ICD-10-CM | POA: Diagnosis not present

## 2013-10-12 DIAGNOSIS — G8254 Quadriplegia, C5-C7 incomplete: Secondary | ICD-10-CM | POA: Diagnosis not present

## 2013-10-12 DIAGNOSIS — I4891 Unspecified atrial fibrillation: Secondary | ICD-10-CM | POA: Diagnosis not present

## 2013-10-12 DIAGNOSIS — Z7901 Long term (current) use of anticoagulants: Secondary | ICD-10-CM | POA: Diagnosis not present

## 2013-10-12 DIAGNOSIS — S14106S Unspecified injury at C6 level of cervical spinal cord, sequela: Secondary | ICD-10-CM | POA: Diagnosis not present

## 2013-10-12 DIAGNOSIS — I2699 Other pulmonary embolism without acute cor pulmonale: Secondary | ICD-10-CM | POA: Diagnosis not present

## 2013-10-12 DIAGNOSIS — S72401D Unspecified fracture of lower end of right femur, subsequent encounter for closed fracture with routine healing: Secondary | ICD-10-CM | POA: Diagnosis not present

## 2013-10-12 DIAGNOSIS — I10 Essential (primary) hypertension: Secondary | ICD-10-CM | POA: Diagnosis not present

## 2013-10-18 DIAGNOSIS — I4891 Unspecified atrial fibrillation: Secondary | ICD-10-CM | POA: Diagnosis not present

## 2013-10-18 DIAGNOSIS — G8254 Quadriplegia, C5-C7 incomplete: Secondary | ICD-10-CM | POA: Diagnosis not present

## 2013-10-18 DIAGNOSIS — S14106S Unspecified injury at C6 level of cervical spinal cord, sequela: Secondary | ICD-10-CM | POA: Diagnosis not present

## 2013-10-18 DIAGNOSIS — I10 Essential (primary) hypertension: Secondary | ICD-10-CM | POA: Diagnosis not present

## 2013-10-18 DIAGNOSIS — I2699 Other pulmonary embolism without acute cor pulmonale: Secondary | ICD-10-CM | POA: Diagnosis not present

## 2013-10-18 DIAGNOSIS — S72401D Unspecified fracture of lower end of right femur, subsequent encounter for closed fracture with routine healing: Secondary | ICD-10-CM | POA: Diagnosis not present

## 2013-10-19 DIAGNOSIS — I4891 Unspecified atrial fibrillation: Secondary | ICD-10-CM | POA: Diagnosis not present

## 2013-10-19 DIAGNOSIS — G8254 Quadriplegia, C5-C7 incomplete: Secondary | ICD-10-CM | POA: Diagnosis not present

## 2013-10-19 DIAGNOSIS — S14106S Unspecified injury at C6 level of cervical spinal cord, sequela: Secondary | ICD-10-CM | POA: Diagnosis not present

## 2013-10-19 DIAGNOSIS — I10 Essential (primary) hypertension: Secondary | ICD-10-CM | POA: Diagnosis not present

## 2013-10-19 DIAGNOSIS — I2699 Other pulmonary embolism without acute cor pulmonale: Secondary | ICD-10-CM | POA: Diagnosis not present

## 2013-10-19 DIAGNOSIS — S72401D Unspecified fracture of lower end of right femur, subsequent encounter for closed fracture with routine healing: Secondary | ICD-10-CM | POA: Diagnosis not present

## 2013-10-22 DIAGNOSIS — I4891 Unspecified atrial fibrillation: Secondary | ICD-10-CM | POA: Diagnosis not present

## 2013-10-22 DIAGNOSIS — G8254 Quadriplegia, C5-C7 incomplete: Secondary | ICD-10-CM | POA: Diagnosis not present

## 2013-10-22 DIAGNOSIS — S14106S Unspecified injury at C6 level of cervical spinal cord, sequela: Secondary | ICD-10-CM | POA: Diagnosis not present

## 2013-10-22 DIAGNOSIS — I2699 Other pulmonary embolism without acute cor pulmonale: Secondary | ICD-10-CM | POA: Diagnosis not present

## 2013-10-22 DIAGNOSIS — S72401D Unspecified fracture of lower end of right femur, subsequent encounter for closed fracture with routine healing: Secondary | ICD-10-CM | POA: Diagnosis not present

## 2013-10-22 DIAGNOSIS — I10 Essential (primary) hypertension: Secondary | ICD-10-CM | POA: Diagnosis not present

## 2013-10-23 DIAGNOSIS — M25559 Pain in unspecified hip: Secondary | ICD-10-CM | POA: Diagnosis not present

## 2013-10-23 DIAGNOSIS — I2699 Other pulmonary embolism without acute cor pulmonale: Secondary | ICD-10-CM | POA: Diagnosis not present

## 2013-10-23 DIAGNOSIS — K219 Gastro-esophageal reflux disease without esophagitis: Secondary | ICD-10-CM | POA: Diagnosis not present

## 2013-10-23 DIAGNOSIS — G609 Hereditary and idiopathic neuropathy, unspecified: Secondary | ICD-10-CM | POA: Diagnosis not present

## 2013-10-23 DIAGNOSIS — I10 Essential (primary) hypertension: Secondary | ICD-10-CM | POA: Diagnosis not present

## 2013-10-24 DIAGNOSIS — I10 Essential (primary) hypertension: Secondary | ICD-10-CM | POA: Diagnosis not present

## 2013-10-24 DIAGNOSIS — S14106S Unspecified injury at C6 level of cervical spinal cord, sequela: Secondary | ICD-10-CM | POA: Diagnosis not present

## 2013-10-24 DIAGNOSIS — G8254 Quadriplegia, C5-C7 incomplete: Secondary | ICD-10-CM | POA: Diagnosis not present

## 2013-10-24 DIAGNOSIS — I2699 Other pulmonary embolism without acute cor pulmonale: Secondary | ICD-10-CM | POA: Diagnosis not present

## 2013-10-24 DIAGNOSIS — S72401D Unspecified fracture of lower end of right femur, subsequent encounter for closed fracture with routine healing: Secondary | ICD-10-CM | POA: Diagnosis not present

## 2013-10-24 DIAGNOSIS — I4891 Unspecified atrial fibrillation: Secondary | ICD-10-CM | POA: Diagnosis not present

## 2013-10-29 DIAGNOSIS — S14106S Unspecified injury at C6 level of cervical spinal cord, sequela: Secondary | ICD-10-CM | POA: Diagnosis not present

## 2013-10-29 DIAGNOSIS — S72401D Unspecified fracture of lower end of right femur, subsequent encounter for closed fracture with routine healing: Secondary | ICD-10-CM | POA: Diagnosis not present

## 2013-10-29 DIAGNOSIS — I2699 Other pulmonary embolism without acute cor pulmonale: Secondary | ICD-10-CM | POA: Diagnosis not present

## 2013-10-29 DIAGNOSIS — I10 Essential (primary) hypertension: Secondary | ICD-10-CM | POA: Diagnosis not present

## 2013-10-29 DIAGNOSIS — G8254 Quadriplegia, C5-C7 incomplete: Secondary | ICD-10-CM | POA: Diagnosis not present

## 2013-10-29 DIAGNOSIS — I4891 Unspecified atrial fibrillation: Secondary | ICD-10-CM | POA: Diagnosis not present

## 2013-11-07 DIAGNOSIS — G8254 Quadriplegia, C5-C7 incomplete: Secondary | ICD-10-CM | POA: Diagnosis not present

## 2013-11-07 DIAGNOSIS — S14106S Unspecified injury at C6 level of cervical spinal cord, sequela: Secondary | ICD-10-CM | POA: Diagnosis not present

## 2013-11-07 DIAGNOSIS — S72401D Unspecified fracture of lower end of right femur, subsequent encounter for closed fracture with routine healing: Secondary | ICD-10-CM | POA: Diagnosis not present

## 2013-11-07 DIAGNOSIS — I10 Essential (primary) hypertension: Secondary | ICD-10-CM | POA: Diagnosis not present

## 2013-11-07 DIAGNOSIS — I4891 Unspecified atrial fibrillation: Secondary | ICD-10-CM | POA: Diagnosis not present

## 2013-11-07 DIAGNOSIS — I2699 Other pulmonary embolism without acute cor pulmonale: Secondary | ICD-10-CM | POA: Diagnosis not present

## 2013-11-09 DIAGNOSIS — G8254 Quadriplegia, C5-C7 incomplete: Secondary | ICD-10-CM | POA: Diagnosis not present

## 2013-11-09 DIAGNOSIS — I2699 Other pulmonary embolism without acute cor pulmonale: Secondary | ICD-10-CM | POA: Diagnosis not present

## 2013-11-09 DIAGNOSIS — S14106S Unspecified injury at C6 level of cervical spinal cord, sequela: Secondary | ICD-10-CM | POA: Diagnosis not present

## 2013-11-09 DIAGNOSIS — I4891 Unspecified atrial fibrillation: Secondary | ICD-10-CM | POA: Diagnosis not present

## 2013-11-09 DIAGNOSIS — I10 Essential (primary) hypertension: Secondary | ICD-10-CM | POA: Diagnosis not present

## 2013-11-09 DIAGNOSIS — S72401D Unspecified fracture of lower end of right femur, subsequent encounter for closed fracture with routine healing: Secondary | ICD-10-CM | POA: Diagnosis not present

## 2013-11-12 DIAGNOSIS — G8254 Quadriplegia, C5-C7 incomplete: Secondary | ICD-10-CM | POA: Diagnosis not present

## 2013-11-12 DIAGNOSIS — I10 Essential (primary) hypertension: Secondary | ICD-10-CM | POA: Diagnosis not present

## 2013-11-12 DIAGNOSIS — S72401D Unspecified fracture of lower end of right femur, subsequent encounter for closed fracture with routine healing: Secondary | ICD-10-CM | POA: Diagnosis not present

## 2013-11-12 DIAGNOSIS — I2699 Other pulmonary embolism without acute cor pulmonale: Secondary | ICD-10-CM | POA: Diagnosis not present

## 2013-11-12 DIAGNOSIS — S14106S Unspecified injury at C6 level of cervical spinal cord, sequela: Secondary | ICD-10-CM | POA: Diagnosis not present

## 2013-11-12 DIAGNOSIS — I4891 Unspecified atrial fibrillation: Secondary | ICD-10-CM | POA: Diagnosis not present

## 2013-11-15 DIAGNOSIS — I10 Essential (primary) hypertension: Secondary | ICD-10-CM | POA: Diagnosis not present

## 2013-11-15 DIAGNOSIS — G8254 Quadriplegia, C5-C7 incomplete: Secondary | ICD-10-CM | POA: Diagnosis not present

## 2013-11-15 DIAGNOSIS — I2699 Other pulmonary embolism without acute cor pulmonale: Secondary | ICD-10-CM | POA: Diagnosis not present

## 2013-11-15 DIAGNOSIS — S14106S Unspecified injury at C6 level of cervical spinal cord, sequela: Secondary | ICD-10-CM | POA: Diagnosis not present

## 2013-11-15 DIAGNOSIS — S72401D Unspecified fracture of lower end of right femur, subsequent encounter for closed fracture with routine healing: Secondary | ICD-10-CM | POA: Diagnosis not present

## 2013-11-15 DIAGNOSIS — I4891 Unspecified atrial fibrillation: Secondary | ICD-10-CM | POA: Diagnosis not present

## 2013-11-19 DIAGNOSIS — G8254 Quadriplegia, C5-C7 incomplete: Secondary | ICD-10-CM | POA: Diagnosis not present

## 2013-11-19 DIAGNOSIS — I10 Essential (primary) hypertension: Secondary | ICD-10-CM | POA: Diagnosis not present

## 2013-11-19 DIAGNOSIS — S14106S Unspecified injury at C6 level of cervical spinal cord, sequela: Secondary | ICD-10-CM | POA: Diagnosis not present

## 2013-11-19 DIAGNOSIS — S72401D Unspecified fracture of lower end of right femur, subsequent encounter for closed fracture with routine healing: Secondary | ICD-10-CM | POA: Diagnosis not present

## 2013-11-19 DIAGNOSIS — I2699 Other pulmonary embolism without acute cor pulmonale: Secondary | ICD-10-CM | POA: Diagnosis not present

## 2013-11-19 DIAGNOSIS — I4891 Unspecified atrial fibrillation: Secondary | ICD-10-CM | POA: Diagnosis not present

## 2013-11-23 DIAGNOSIS — S14106S Unspecified injury at C6 level of cervical spinal cord, sequela: Secondary | ICD-10-CM | POA: Diagnosis not present

## 2013-11-23 DIAGNOSIS — I10 Essential (primary) hypertension: Secondary | ICD-10-CM | POA: Diagnosis not present

## 2013-11-23 DIAGNOSIS — I4891 Unspecified atrial fibrillation: Secondary | ICD-10-CM | POA: Diagnosis not present

## 2013-11-23 DIAGNOSIS — G8254 Quadriplegia, C5-C7 incomplete: Secondary | ICD-10-CM | POA: Diagnosis not present

## 2013-11-23 DIAGNOSIS — I2699 Other pulmonary embolism without acute cor pulmonale: Secondary | ICD-10-CM | POA: Diagnosis not present

## 2013-11-23 DIAGNOSIS — S72401D Unspecified fracture of lower end of right femur, subsequent encounter for closed fracture with routine healing: Secondary | ICD-10-CM | POA: Diagnosis not present

## 2013-11-26 DIAGNOSIS — G8254 Quadriplegia, C5-C7 incomplete: Secondary | ICD-10-CM | POA: Diagnosis not present

## 2013-11-26 DIAGNOSIS — I4891 Unspecified atrial fibrillation: Secondary | ICD-10-CM | POA: Diagnosis not present

## 2013-11-26 DIAGNOSIS — S72401D Unspecified fracture of lower end of right femur, subsequent encounter for closed fracture with routine healing: Secondary | ICD-10-CM | POA: Diagnosis not present

## 2013-11-26 DIAGNOSIS — S14106S Unspecified injury at C6 level of cervical spinal cord, sequela: Secondary | ICD-10-CM | POA: Diagnosis not present

## 2013-11-26 DIAGNOSIS — I2699 Other pulmonary embolism without acute cor pulmonale: Secondary | ICD-10-CM | POA: Diagnosis not present

## 2013-11-26 DIAGNOSIS — I10 Essential (primary) hypertension: Secondary | ICD-10-CM | POA: Diagnosis not present

## 2013-11-29 DIAGNOSIS — S72401D Unspecified fracture of lower end of right femur, subsequent encounter for closed fracture with routine healing: Secondary | ICD-10-CM | POA: Diagnosis not present

## 2013-11-29 DIAGNOSIS — I4891 Unspecified atrial fibrillation: Secondary | ICD-10-CM | POA: Diagnosis not present

## 2013-11-29 DIAGNOSIS — G8254 Quadriplegia, C5-C7 incomplete: Secondary | ICD-10-CM | POA: Diagnosis not present

## 2013-11-29 DIAGNOSIS — I10 Essential (primary) hypertension: Secondary | ICD-10-CM | POA: Diagnosis not present

## 2013-11-29 DIAGNOSIS — I2699 Other pulmonary embolism without acute cor pulmonale: Secondary | ICD-10-CM | POA: Diagnosis not present

## 2013-11-29 DIAGNOSIS — S14106S Unspecified injury at C6 level of cervical spinal cord, sequela: Secondary | ICD-10-CM | POA: Diagnosis not present

## 2013-12-05 DIAGNOSIS — I10 Essential (primary) hypertension: Secondary | ICD-10-CM | POA: Diagnosis not present

## 2013-12-05 DIAGNOSIS — S72401D Unspecified fracture of lower end of right femur, subsequent encounter for closed fracture with routine healing: Secondary | ICD-10-CM | POA: Diagnosis not present

## 2013-12-05 DIAGNOSIS — S14106S Unspecified injury at C6 level of cervical spinal cord, sequela: Secondary | ICD-10-CM | POA: Diagnosis not present

## 2013-12-05 DIAGNOSIS — I4891 Unspecified atrial fibrillation: Secondary | ICD-10-CM | POA: Diagnosis not present

## 2013-12-05 DIAGNOSIS — G8254 Quadriplegia, C5-C7 incomplete: Secondary | ICD-10-CM | POA: Diagnosis not present

## 2013-12-05 DIAGNOSIS — I2699 Other pulmonary embolism without acute cor pulmonale: Secondary | ICD-10-CM | POA: Diagnosis not present

## 2013-12-07 DIAGNOSIS — G8254 Quadriplegia, C5-C7 incomplete: Secondary | ICD-10-CM | POA: Diagnosis not present

## 2013-12-07 DIAGNOSIS — I10 Essential (primary) hypertension: Secondary | ICD-10-CM | POA: Diagnosis not present

## 2013-12-07 DIAGNOSIS — I4891 Unspecified atrial fibrillation: Secondary | ICD-10-CM | POA: Diagnosis not present

## 2013-12-07 DIAGNOSIS — S14106S Unspecified injury at C6 level of cervical spinal cord, sequela: Secondary | ICD-10-CM | POA: Diagnosis not present

## 2013-12-07 DIAGNOSIS — S72401D Unspecified fracture of lower end of right femur, subsequent encounter for closed fracture with routine healing: Secondary | ICD-10-CM | POA: Diagnosis not present

## 2013-12-07 DIAGNOSIS — I2699 Other pulmonary embolism without acute cor pulmonale: Secondary | ICD-10-CM | POA: Diagnosis not present

## 2013-12-11 DIAGNOSIS — G8254 Quadriplegia, C5-C7 incomplete: Secondary | ICD-10-CM | POA: Diagnosis not present

## 2013-12-11 DIAGNOSIS — S14106S Unspecified injury at C6 level of cervical spinal cord, sequela: Secondary | ICD-10-CM | POA: Diagnosis not present

## 2013-12-11 DIAGNOSIS — I4891 Unspecified atrial fibrillation: Secondary | ICD-10-CM | POA: Diagnosis not present

## 2013-12-11 DIAGNOSIS — Z7901 Long term (current) use of anticoagulants: Secondary | ICD-10-CM | POA: Diagnosis not present

## 2013-12-11 DIAGNOSIS — I10 Essential (primary) hypertension: Secondary | ICD-10-CM | POA: Diagnosis not present

## 2013-12-11 DIAGNOSIS — I2699 Other pulmonary embolism without acute cor pulmonale: Secondary | ICD-10-CM | POA: Diagnosis not present

## 2013-12-11 DIAGNOSIS — S72401D Unspecified fracture of lower end of right femur, subsequent encounter for closed fracture with routine healing: Secondary | ICD-10-CM | POA: Diagnosis not present

## 2013-12-13 DIAGNOSIS — G8254 Quadriplegia, C5-C7 incomplete: Secondary | ICD-10-CM | POA: Diagnosis not present

## 2013-12-13 DIAGNOSIS — I10 Essential (primary) hypertension: Secondary | ICD-10-CM | POA: Diagnosis not present

## 2013-12-13 DIAGNOSIS — S72401D Unspecified fracture of lower end of right femur, subsequent encounter for closed fracture with routine healing: Secondary | ICD-10-CM | POA: Diagnosis not present

## 2013-12-13 DIAGNOSIS — I4891 Unspecified atrial fibrillation: Secondary | ICD-10-CM | POA: Diagnosis not present

## 2013-12-13 DIAGNOSIS — S14106S Unspecified injury at C6 level of cervical spinal cord, sequela: Secondary | ICD-10-CM | POA: Diagnosis not present

## 2013-12-13 DIAGNOSIS — I2699 Other pulmonary embolism without acute cor pulmonale: Secondary | ICD-10-CM | POA: Diagnosis not present

## 2013-12-18 DIAGNOSIS — I10 Essential (primary) hypertension: Secondary | ICD-10-CM | POA: Diagnosis not present

## 2013-12-18 DIAGNOSIS — S72401D Unspecified fracture of lower end of right femur, subsequent encounter for closed fracture with routine healing: Secondary | ICD-10-CM | POA: Diagnosis not present

## 2013-12-18 DIAGNOSIS — S14106S Unspecified injury at C6 level of cervical spinal cord, sequela: Secondary | ICD-10-CM | POA: Diagnosis not present

## 2013-12-18 DIAGNOSIS — I4891 Unspecified atrial fibrillation: Secondary | ICD-10-CM | POA: Diagnosis not present

## 2013-12-18 DIAGNOSIS — I2699 Other pulmonary embolism without acute cor pulmonale: Secondary | ICD-10-CM | POA: Diagnosis not present

## 2013-12-18 DIAGNOSIS — G8254 Quadriplegia, C5-C7 incomplete: Secondary | ICD-10-CM | POA: Diagnosis not present

## 2013-12-21 DIAGNOSIS — I4891 Unspecified atrial fibrillation: Secondary | ICD-10-CM | POA: Diagnosis not present

## 2013-12-21 DIAGNOSIS — I10 Essential (primary) hypertension: Secondary | ICD-10-CM | POA: Diagnosis not present

## 2013-12-21 DIAGNOSIS — G8254 Quadriplegia, C5-C7 incomplete: Secondary | ICD-10-CM | POA: Diagnosis not present

## 2013-12-21 DIAGNOSIS — I2699 Other pulmonary embolism without acute cor pulmonale: Secondary | ICD-10-CM | POA: Diagnosis not present

## 2013-12-21 DIAGNOSIS — S14106S Unspecified injury at C6 level of cervical spinal cord, sequela: Secondary | ICD-10-CM | POA: Diagnosis not present

## 2013-12-21 DIAGNOSIS — S72401D Unspecified fracture of lower end of right femur, subsequent encounter for closed fracture with routine healing: Secondary | ICD-10-CM | POA: Diagnosis not present

## 2013-12-24 DIAGNOSIS — I4891 Unspecified atrial fibrillation: Secondary | ICD-10-CM | POA: Diagnosis not present

## 2013-12-24 DIAGNOSIS — I2699 Other pulmonary embolism without acute cor pulmonale: Secondary | ICD-10-CM | POA: Diagnosis not present

## 2013-12-24 DIAGNOSIS — G8254 Quadriplegia, C5-C7 incomplete: Secondary | ICD-10-CM | POA: Diagnosis not present

## 2013-12-24 DIAGNOSIS — S14106S Unspecified injury at C6 level of cervical spinal cord, sequela: Secondary | ICD-10-CM | POA: Diagnosis not present

## 2013-12-24 DIAGNOSIS — S72401D Unspecified fracture of lower end of right femur, subsequent encounter for closed fracture with routine healing: Secondary | ICD-10-CM | POA: Diagnosis not present

## 2013-12-24 DIAGNOSIS — I10 Essential (primary) hypertension: Secondary | ICD-10-CM | POA: Diagnosis not present

## 2013-12-25 DIAGNOSIS — E291 Testicular hypofunction: Secondary | ICD-10-CM | POA: Diagnosis not present

## 2013-12-25 DIAGNOSIS — I2699 Other pulmonary embolism without acute cor pulmonale: Secondary | ICD-10-CM | POA: Diagnosis not present

## 2013-12-25 DIAGNOSIS — G609 Hereditary and idiopathic neuropathy, unspecified: Secondary | ICD-10-CM | POA: Diagnosis not present

## 2013-12-25 DIAGNOSIS — E780 Pure hypercholesterolemia: Secondary | ICD-10-CM | POA: Diagnosis not present

## 2013-12-25 DIAGNOSIS — K219 Gastro-esophageal reflux disease without esophagitis: Secondary | ICD-10-CM | POA: Diagnosis not present

## 2013-12-25 DIAGNOSIS — M159 Polyosteoarthritis, unspecified: Secondary | ICD-10-CM | POA: Diagnosis not present

## 2013-12-25 DIAGNOSIS — M25552 Pain in left hip: Secondary | ICD-10-CM | POA: Diagnosis not present

## 2013-12-25 DIAGNOSIS — I10 Essential (primary) hypertension: Secondary | ICD-10-CM | POA: Diagnosis not present

## 2013-12-26 ENCOUNTER — Encounter (HOSPITAL_COMMUNITY): Payer: Self-pay | Admitting: *Deleted

## 2013-12-26 ENCOUNTER — Emergency Department (HOSPITAL_COMMUNITY): Payer: Medicare Other

## 2013-12-26 ENCOUNTER — Emergency Department (HOSPITAL_COMMUNITY)
Admission: EM | Admit: 2013-12-26 | Discharge: 2013-12-26 | Disposition: A | Discharge status: Home / Self Care | Payer: Medicare Other | Attending: Emergency Medicine | Admitting: Emergency Medicine

## 2013-12-26 DIAGNOSIS — Z8739 Personal history of other diseases of the musculoskeletal system and connective tissue: Secondary | ICD-10-CM | POA: Insufficient documentation

## 2013-12-26 DIAGNOSIS — R202 Paresthesia of skin: Secondary | ICD-10-CM

## 2013-12-26 DIAGNOSIS — R200 Anesthesia of skin: Secondary | ICD-10-CM | POA: Diagnosis not present

## 2013-12-26 DIAGNOSIS — Z87828 Personal history of other (healed) physical injury and trauma: Secondary | ICD-10-CM | POA: Diagnosis not present

## 2013-12-26 DIAGNOSIS — R3129 Other microscopic hematuria: Secondary | ICD-10-CM

## 2013-12-26 DIAGNOSIS — Z8659 Personal history of other mental and behavioral disorders: Secondary | ICD-10-CM | POA: Diagnosis not present

## 2013-12-26 DIAGNOSIS — K219 Gastro-esophageal reflux disease without esophagitis: Secondary | ICD-10-CM | POA: Diagnosis not present

## 2013-12-26 DIAGNOSIS — R312 Other microscopic hematuria: Secondary | ICD-10-CM | POA: Diagnosis not present

## 2013-12-26 DIAGNOSIS — I129 Hypertensive chronic kidney disease with stage 1 through stage 4 chronic kidney disease, or unspecified chronic kidney disease: Secondary | ICD-10-CM | POA: Insufficient documentation

## 2013-12-26 DIAGNOSIS — Z79899 Other long term (current) drug therapy: Secondary | ICD-10-CM | POA: Diagnosis not present

## 2013-12-26 DIAGNOSIS — G9389 Other specified disorders of brain: Secondary | ICD-10-CM | POA: Diagnosis not present

## 2013-12-26 DIAGNOSIS — N189 Chronic kidney disease, unspecified: Secondary | ICD-10-CM | POA: Diagnosis not present

## 2013-12-26 DIAGNOSIS — Z7901 Long term (current) use of anticoagulants: Secondary | ICD-10-CM | POA: Insufficient documentation

## 2013-12-26 DIAGNOSIS — Z8669 Personal history of other diseases of the nervous system and sense organs: Secondary | ICD-10-CM | POA: Diagnosis not present

## 2013-12-26 DIAGNOSIS — R209 Unspecified disturbances of skin sensation: Secondary | ICD-10-CM | POA: Diagnosis not present

## 2013-12-26 LAB — CBC WITH DIFFERENTIAL/PLATELET
BASOS ABS: 0 10*3/uL (ref 0.0–0.1)
BASOS PCT: 0 % (ref 0–1)
Eosinophils Absolute: 0 10*3/uL (ref 0.0–0.7)
Eosinophils Relative: 0 % (ref 0–5)
HCT: 39 % (ref 39.0–52.0)
Hemoglobin: 13.4 g/dL (ref 13.0–17.0)
Lymphocytes Relative: 14 % (ref 12–46)
Lymphs Abs: 1.6 10*3/uL (ref 0.7–4.0)
MCH: 29.8 pg (ref 26.0–34.0)
MCHC: 34.4 g/dL (ref 30.0–36.0)
MCV: 86.7 fL (ref 78.0–100.0)
Monocytes Absolute: 0.5 10*3/uL (ref 0.1–1.0)
Monocytes Relative: 5 % (ref 3–12)
NEUTROS PCT: 81 % — AB (ref 43–77)
Neutro Abs: 9.1 10*3/uL — ABNORMAL HIGH (ref 1.7–7.7)
PLATELETS: 286 10*3/uL (ref 150–400)
RBC: 4.5 MIL/uL (ref 4.22–5.81)
RDW: 14.1 % (ref 11.5–15.5)
WBC: 11.2 10*3/uL — ABNORMAL HIGH (ref 4.0–10.5)

## 2013-12-26 LAB — BASIC METABOLIC PANEL
Anion gap: 13 (ref 5–15)
BUN: 14 mg/dL (ref 6–23)
CO2: 24 mEq/L (ref 19–32)
Calcium: 9.2 mg/dL (ref 8.4–10.5)
Chloride: 102 mEq/L (ref 96–112)
Creatinine, Ser: 0.62 mg/dL (ref 0.50–1.35)
Glucose, Bld: 119 mg/dL — ABNORMAL HIGH (ref 70–99)
POTASSIUM: 4.4 meq/L (ref 3.7–5.3)
SODIUM: 139 meq/L (ref 137–147)

## 2013-12-26 LAB — URINALYSIS, ROUTINE W REFLEX MICROSCOPIC
Bilirubin Urine: NEGATIVE
Glucose, UA: NEGATIVE mg/dL
Ketones, ur: NEGATIVE mg/dL
LEUKOCYTES UA: NEGATIVE
Nitrite: NEGATIVE
Protein, ur: NEGATIVE mg/dL
Specific Gravity, Urine: 1.016 (ref 1.005–1.030)
UROBILINOGEN UA: 1 mg/dL (ref 0.0–1.0)
pH: 7.5 (ref 5.0–8.0)

## 2013-12-26 LAB — URINE MICROSCOPIC-ADD ON

## 2013-12-26 NOTE — ED Notes (Signed)
Bed: WA02 Expected date:  Expected time:  Means of arrival:  Comments: EMS/shoulder pain

## 2013-12-26 NOTE — ED Notes (Addendum)
Per EMS, pt awoke with numbness in his left arm. Pt states symptoms have since resolved, but pt states he would like to get an evaluation. Pt has hx of paraplegia, anxiety.

## 2013-12-26 NOTE — Discharge Instructions (Signed)
Paresthesia Paresthesia is an abnormal burning or prickling sensation. This sensation is generally felt in the hands, arms, legs, or feet. However, it may occur in any part of the body. It is usually not painful. The feeling may be described as:  Tingling or numbness.  "Pins and needles."  Skin crawling.  Buzzing.  Limbs "falling asleep."  Itching. Most people experience temporary (transient) paresthesia at some time in their lives. CAUSES  Paresthesia may occur when you breathe too quickly (hyperventilation). It can also occur without any apparent cause. Commonly, paresthesia occurs when pressure is placed on a nerve. The feeling quickly goes away once the pressure is removed. For some people, however, paresthesia is a long-lasting (chronic) condition caused by an underlying disorder. The underlying disorder may be:  A traumatic, direct injury to nerves. Examples include a:  Broken (fractured) neck.  Fractured skull.  A disorder affecting the brain and spinal cord (central nervous system). Examples include:  Transverse myelitis.  Encephalitis.  Transient ischemic attack.  Multiple sclerosis.  Stroke.  Tumor or blood vessel problems, such as an arteriovenous malformation pressing against the brain or spinal cord.  A condition that damages the peripheral nerves (peripheral neuropathy). Peripheral nerves are not part of the brain and spinal cord. These conditions include:  Diabetes.  Peripheral vascular disease.  Nerve entrapment syndromes, such as carpal tunnel syndrome.  Shingles.  Hypothyroidism.  Vitamin B12 deficiencies.  Alcoholism.  Heavy metal poisoning (lead, arsenic).  Rheumatoid arthritis.  Systemic lupus erythematosus. DIAGNOSIS  Your caregiver will attempt to find the underlying cause of your paresthesia. Your caregiver may:  Take your medical history.  Perform a physical exam.  Order various lab tests.  Order imaging tests. TREATMENT    Treatment for paresthesia depends on the underlying cause. HOME CARE INSTRUCTIONS  Avoid drinking alcohol.  You may consider massage or acupuncture to help relieve your symptoms.  Keep all follow-up appointments as directed by your caregiver. SEEK IMMEDIATE MEDICAL CARE IF:   You feel weak.  You have trouble walking or moving.  You have problems with speech or vision.  You feel confused.  You cannot control your bladder or bowel movements.  You feel numbness after an injury.  You faint.  Your burning or prickling feeling gets worse when walking.  You have pain, cramps, or dizziness.  You develop a rash. MAKE SURE YOU:  Understand these instructions.  Will watch your condition.  Will get help right away if you are not doing well or get worse. Document Released: 01/15/2002 Document Revised: 04/19/2011 Document Reviewed: 10/16/2010 [facility] Patient Information 2015 [place], [place]. This information is not intended to replace advice given to you by your health care provider. Make sure you discuss any questions you have with your health care provider.  Hematuria Hematuria is blood in your urine. It can be caused by a bladder infection, kidney infection, prostate infection, kidney stone, or cancer of your urinary tract. Infections can usually be treated with medicine, and a kidney stone usually will pass through your urine. If neither of these is the cause of your hematuria, further workup to find out the reason may be needed. It is very important that you tell your health care provider about any blood you see in your urine, even if the blood stops without treatment or happens without causing pain. Blood in your urine that happens and then stops and then happens again can be a symptom of a very serious condition. Also, pain is not a symptom in  the initial stages of many urinary cancers. HOME CARE INSTRUCTIONS   Drink lots of fluid, 3-4 quarts a day. If you have been diagnosed  with an infection, cranberry juice is especially recommended, in addition to large amounts of water.  Avoid caffeine, tea, and carbonated beverages because they tend to irritate the bladder.  Avoid alcohol because it may irritate the prostate.  Take all medicines as directed by your health care provider.  If you were prescribed an antibiotic medicine, finish it all even if you start to feel better.  If you have been diagnosed with a kidney stone, follow your health care provider's instructions regarding straining your urine to catch the stone.  Empty your bladder often. Avoid holding urine for long periods of time.  After a bowel movement, women should cleanse front to back. Use each tissue only once.  Empty your bladder before and after sexual intercourse if you are a male. SEEK MEDICAL CARE IF:  You develop back pain.  You have a fever.  You have a feeling of sickness in your stomach (nausea) or vomiting.  Your symptoms are not better in 3 days. Return sooner if you are getting worse. SEEK IMMEDIATE MEDICAL CARE IF:   You develop severe vomiting and are unable to keep the medicine down.  You develop severe back or abdominal pain despite taking your medicines.  You begin passing a large amount of blood or clots in your urine.  You feel extremely weak or faint, or you pass out. MAKE SURE YOU:   Understand these instructions.  Will watch your condition.  Will get help right away if you are not doing well or get worse. Document Released: 01/25/2005 Document Revised: 06/11/2013 Document Reviewed: 09/25/2012 [facility] IncExitCare Patient Information 2015 [place], [place]. This information is not intended to replace advice given to you by your health care provider. Make sure you discuss any questions you have with your health care provider.
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
## 2013-12-27 DIAGNOSIS — I10 Essential (primary) hypertension: Secondary | ICD-10-CM | POA: Diagnosis not present

## 2013-12-27 LAB — URINE CULTURE
Colony Count: NO GROWTH
Culture: NO GROWTH

## 2013-12-28 DIAGNOSIS — S14106S Unspecified injury at C6 level of cervical spinal cord, sequela: Secondary | ICD-10-CM | POA: Diagnosis not present

## 2013-12-28 DIAGNOSIS — I4891 Unspecified atrial fibrillation: Secondary | ICD-10-CM | POA: Diagnosis not present

## 2013-12-28 DIAGNOSIS — I10 Essential (primary) hypertension: Secondary | ICD-10-CM | POA: Diagnosis not present

## 2013-12-28 DIAGNOSIS — G8254 Quadriplegia, C5-C7 incomplete: Secondary | ICD-10-CM | POA: Diagnosis not present

## 2013-12-28 DIAGNOSIS — S72401D Unspecified fracture of lower end of right femur, subsequent encounter for closed fracture with routine healing: Secondary | ICD-10-CM | POA: Diagnosis not present

## 2013-12-28 DIAGNOSIS — I2699 Other pulmonary embolism without acute cor pulmonale: Secondary | ICD-10-CM | POA: Diagnosis not present

## 2013-12-31 DIAGNOSIS — S14106S Unspecified injury at C6 level of cervical spinal cord, sequela: Secondary | ICD-10-CM | POA: Diagnosis not present

## 2013-12-31 DIAGNOSIS — I2699 Other pulmonary embolism without acute cor pulmonale: Secondary | ICD-10-CM | POA: Diagnosis not present

## 2013-12-31 DIAGNOSIS — I4891 Unspecified atrial fibrillation: Secondary | ICD-10-CM | POA: Diagnosis not present

## 2013-12-31 DIAGNOSIS — G8254 Quadriplegia, C5-C7 incomplete: Secondary | ICD-10-CM | POA: Diagnosis not present

## 2013-12-31 DIAGNOSIS — I10 Essential (primary) hypertension: Secondary | ICD-10-CM | POA: Diagnosis not present

## 2013-12-31 DIAGNOSIS — S72401D Unspecified fracture of lower end of right femur, subsequent encounter for closed fracture with routine healing: Secondary | ICD-10-CM | POA: Diagnosis not present

## 2014-01-02 DIAGNOSIS — G8254 Quadriplegia, C5-C7 incomplete: Secondary | ICD-10-CM | POA: Diagnosis not present

## 2014-01-02 DIAGNOSIS — S72401D Unspecified fracture of lower end of right femur, subsequent encounter for closed fracture with routine healing: Secondary | ICD-10-CM | POA: Diagnosis not present

## 2014-01-02 DIAGNOSIS — I10 Essential (primary) hypertension: Secondary | ICD-10-CM | POA: Diagnosis not present

## 2014-01-02 DIAGNOSIS — S14106S Unspecified injury at C6 level of cervical spinal cord, sequela: Secondary | ICD-10-CM | POA: Diagnosis not present

## 2014-01-02 DIAGNOSIS — I2699 Other pulmonary embolism without acute cor pulmonale: Secondary | ICD-10-CM | POA: Diagnosis not present

## 2014-01-02 DIAGNOSIS — I4891 Unspecified atrial fibrillation: Secondary | ICD-10-CM | POA: Diagnosis not present

## 2014-01-07 DIAGNOSIS — I4891 Unspecified atrial fibrillation: Secondary | ICD-10-CM | POA: Diagnosis not present

## 2014-01-07 DIAGNOSIS — I10 Essential (primary) hypertension: Secondary | ICD-10-CM | POA: Diagnosis not present

## 2014-01-07 DIAGNOSIS — S14106S Unspecified injury at C6 level of cervical spinal cord, sequela: Secondary | ICD-10-CM | POA: Diagnosis not present

## 2014-01-07 DIAGNOSIS — S72401D Unspecified fracture of lower end of right femur, subsequent encounter for closed fracture with routine healing: Secondary | ICD-10-CM | POA: Diagnosis not present

## 2014-01-07 DIAGNOSIS — G8254 Quadriplegia, C5-C7 incomplete: Secondary | ICD-10-CM | POA: Diagnosis not present

## 2014-01-07 DIAGNOSIS — I2699 Other pulmonary embolism without acute cor pulmonale: Secondary | ICD-10-CM | POA: Diagnosis not present

## 2014-01-10 DIAGNOSIS — I4891 Unspecified atrial fibrillation: Secondary | ICD-10-CM | POA: Diagnosis not present

## 2014-01-10 DIAGNOSIS — S14106S Unspecified injury at C6 level of cervical spinal cord, sequela: Secondary | ICD-10-CM | POA: Diagnosis not present

## 2014-01-10 DIAGNOSIS — I10 Essential (primary) hypertension: Secondary | ICD-10-CM | POA: Diagnosis not present

## 2014-01-10 DIAGNOSIS — G8254 Quadriplegia, C5-C7 incomplete: Secondary | ICD-10-CM | POA: Diagnosis not present

## 2014-01-10 DIAGNOSIS — S72401D Unspecified fracture of lower end of right femur, subsequent encounter for closed fracture with routine healing: Secondary | ICD-10-CM | POA: Diagnosis not present

## 2014-01-10 DIAGNOSIS — I2699 Other pulmonary embolism without acute cor pulmonale: Secondary | ICD-10-CM | POA: Diagnosis not present

## 2014-01-14 DIAGNOSIS — S14106S Unspecified injury at C6 level of cervical spinal cord, sequela: Secondary | ICD-10-CM | POA: Diagnosis not present

## 2014-01-14 DIAGNOSIS — I4891 Unspecified atrial fibrillation: Secondary | ICD-10-CM | POA: Diagnosis not present

## 2014-01-14 DIAGNOSIS — I2699 Other pulmonary embolism without acute cor pulmonale: Secondary | ICD-10-CM | POA: Diagnosis not present

## 2014-01-14 DIAGNOSIS — S72401D Unspecified fracture of lower end of right femur, subsequent encounter for closed fracture with routine healing: Secondary | ICD-10-CM | POA: Diagnosis not present

## 2014-01-14 DIAGNOSIS — G8254 Quadriplegia, C5-C7 incomplete: Secondary | ICD-10-CM | POA: Diagnosis not present

## 2014-01-14 DIAGNOSIS — I10 Essential (primary) hypertension: Secondary | ICD-10-CM | POA: Diagnosis not present

## 2014-01-18 DIAGNOSIS — I4891 Unspecified atrial fibrillation: Secondary | ICD-10-CM | POA: Diagnosis not present

## 2014-01-18 DIAGNOSIS — G8254 Quadriplegia, C5-C7 incomplete: Secondary | ICD-10-CM | POA: Diagnosis not present

## 2014-01-18 DIAGNOSIS — S72401D Unspecified fracture of lower end of right femur, subsequent encounter for closed fracture with routine healing: Secondary | ICD-10-CM | POA: Diagnosis not present

## 2014-01-18 DIAGNOSIS — S14106S Unspecified injury at C6 level of cervical spinal cord, sequela: Secondary | ICD-10-CM | POA: Diagnosis not present

## 2014-01-18 DIAGNOSIS — I2699 Other pulmonary embolism without acute cor pulmonale: Secondary | ICD-10-CM | POA: Diagnosis not present

## 2014-01-18 DIAGNOSIS — I10 Essential (primary) hypertension: Secondary | ICD-10-CM | POA: Diagnosis not present

## 2014-01-21 DIAGNOSIS — I2699 Other pulmonary embolism without acute cor pulmonale: Secondary | ICD-10-CM | POA: Diagnosis not present

## 2014-01-21 DIAGNOSIS — S14106S Unspecified injury at C6 level of cervical spinal cord, sequela: Secondary | ICD-10-CM | POA: Diagnosis not present

## 2014-01-21 DIAGNOSIS — S72401D Unspecified fracture of lower end of right femur, subsequent encounter for closed fracture with routine healing: Secondary | ICD-10-CM | POA: Diagnosis not present

## 2014-01-21 DIAGNOSIS — I4891 Unspecified atrial fibrillation: Secondary | ICD-10-CM | POA: Diagnosis not present

## 2014-01-21 DIAGNOSIS — I10 Essential (primary) hypertension: Secondary | ICD-10-CM | POA: Diagnosis not present

## 2014-01-21 DIAGNOSIS — G8254 Quadriplegia, C5-C7 incomplete: Secondary | ICD-10-CM | POA: Diagnosis not present

## 2014-01-28 DIAGNOSIS — I10 Essential (primary) hypertension: Secondary | ICD-10-CM | POA: Diagnosis not present

## 2014-01-28 DIAGNOSIS — I2699 Other pulmonary embolism without acute cor pulmonale: Secondary | ICD-10-CM | POA: Diagnosis not present

## 2014-01-28 DIAGNOSIS — S14106S Unspecified injury at C6 level of cervical spinal cord, sequela: Secondary | ICD-10-CM | POA: Diagnosis not present

## 2014-01-28 DIAGNOSIS — S72401D Unspecified fracture of lower end of right femur, subsequent encounter for closed fracture with routine healing: Secondary | ICD-10-CM | POA: Diagnosis not present

## 2014-01-28 DIAGNOSIS — I4891 Unspecified atrial fibrillation: Secondary | ICD-10-CM | POA: Diagnosis not present

## 2014-01-28 DIAGNOSIS — G8254 Quadriplegia, C5-C7 incomplete: Secondary | ICD-10-CM | POA: Diagnosis not present

## 2014-01-30 DIAGNOSIS — S14106S Unspecified injury at C6 level of cervical spinal cord, sequela: Secondary | ICD-10-CM | POA: Diagnosis not present

## 2014-01-30 DIAGNOSIS — I2699 Other pulmonary embolism without acute cor pulmonale: Secondary | ICD-10-CM | POA: Diagnosis not present

## 2014-01-30 DIAGNOSIS — I10 Essential (primary) hypertension: Secondary | ICD-10-CM | POA: Diagnosis not present

## 2014-01-30 DIAGNOSIS — I4891 Unspecified atrial fibrillation: Secondary | ICD-10-CM | POA: Diagnosis not present

## 2014-01-30 DIAGNOSIS — G8254 Quadriplegia, C5-C7 incomplete: Secondary | ICD-10-CM | POA: Diagnosis not present

## 2014-01-30 DIAGNOSIS — S72401D Unspecified fracture of lower end of right femur, subsequent encounter for closed fracture with routine healing: Secondary | ICD-10-CM | POA: Diagnosis not present

## 2014-02-06 DIAGNOSIS — I2699 Other pulmonary embolism without acute cor pulmonale: Secondary | ICD-10-CM | POA: Diagnosis not present

## 2014-02-06 DIAGNOSIS — S72401D Unspecified fracture of lower end of right femur, subsequent encounter for closed fracture with routine healing: Secondary | ICD-10-CM | POA: Diagnosis not present

## 2014-02-06 DIAGNOSIS — I4891 Unspecified atrial fibrillation: Secondary | ICD-10-CM | POA: Diagnosis not present

## 2014-02-06 DIAGNOSIS — S14106S Unspecified injury at C6 level of cervical spinal cord, sequela: Secondary | ICD-10-CM | POA: Diagnosis not present

## 2014-02-06 DIAGNOSIS — I10 Essential (primary) hypertension: Secondary | ICD-10-CM | POA: Diagnosis not present

## 2014-02-06 DIAGNOSIS — G8254 Quadriplegia, C5-C7 incomplete: Secondary | ICD-10-CM | POA: Diagnosis not present

## 2014-02-07 DIAGNOSIS — S14106S Unspecified injury at C6 level of cervical spinal cord, sequela: Secondary | ICD-10-CM | POA: Diagnosis not present

## 2014-02-07 DIAGNOSIS — S72401D Unspecified fracture of lower end of right femur, subsequent encounter for closed fracture with routine healing: Secondary | ICD-10-CM | POA: Diagnosis not present

## 2014-02-07 DIAGNOSIS — G8254 Quadriplegia, C5-C7 incomplete: Secondary | ICD-10-CM | POA: Diagnosis not present

## 2014-02-07 DIAGNOSIS — I2699 Other pulmonary embolism without acute cor pulmonale: Secondary | ICD-10-CM | POA: Diagnosis not present

## 2014-02-07 DIAGNOSIS — I4891 Unspecified atrial fibrillation: Secondary | ICD-10-CM | POA: Diagnosis not present

## 2014-02-07 DIAGNOSIS — I10 Essential (primary) hypertension: Secondary | ICD-10-CM | POA: Diagnosis not present

## 2014-02-09 DIAGNOSIS — S72401D Unspecified fracture of lower end of right femur, subsequent encounter for closed fracture with routine healing: Secondary | ICD-10-CM | POA: Diagnosis not present

## 2014-02-09 DIAGNOSIS — I4891 Unspecified atrial fibrillation: Secondary | ICD-10-CM | POA: Diagnosis not present

## 2014-02-09 DIAGNOSIS — I2699 Other pulmonary embolism without acute cor pulmonale: Secondary | ICD-10-CM | POA: Diagnosis not present

## 2014-02-09 DIAGNOSIS — S14106S Unspecified injury at C6 level of cervical spinal cord, sequela: Secondary | ICD-10-CM | POA: Diagnosis not present

## 2014-02-09 DIAGNOSIS — G8254 Quadriplegia, C5-C7 incomplete: Secondary | ICD-10-CM | POA: Diagnosis not present

## 2014-02-09 DIAGNOSIS — I10 Essential (primary) hypertension: Secondary | ICD-10-CM | POA: Diagnosis not present

## 2014-02-09 DIAGNOSIS — Z7901 Long term (current) use of anticoagulants: Secondary | ICD-10-CM | POA: Diagnosis not present

## 2014-02-13 DIAGNOSIS — I2699 Other pulmonary embolism without acute cor pulmonale: Secondary | ICD-10-CM | POA: Diagnosis not present

## 2014-02-13 DIAGNOSIS — G8254 Quadriplegia, C5-C7 incomplete: Secondary | ICD-10-CM | POA: Diagnosis not present

## 2014-02-13 DIAGNOSIS — S14106S Unspecified injury at C6 level of cervical spinal cord, sequela: Secondary | ICD-10-CM | POA: Diagnosis not present

## 2014-02-13 DIAGNOSIS — I10 Essential (primary) hypertension: Secondary | ICD-10-CM | POA: Diagnosis not present

## 2014-02-13 DIAGNOSIS — S72401D Unspecified fracture of lower end of right femur, subsequent encounter for closed fracture with routine healing: Secondary | ICD-10-CM | POA: Diagnosis not present

## 2014-02-13 DIAGNOSIS — I4891 Unspecified atrial fibrillation: Secondary | ICD-10-CM | POA: Diagnosis not present

## 2014-02-18 DIAGNOSIS — I10 Essential (primary) hypertension: Secondary | ICD-10-CM | POA: Diagnosis not present

## 2014-02-18 DIAGNOSIS — S72401D Unspecified fracture of lower end of right femur, subsequent encounter for closed fracture with routine healing: Secondary | ICD-10-CM | POA: Diagnosis not present

## 2014-02-18 DIAGNOSIS — I2699 Other pulmonary embolism without acute cor pulmonale: Secondary | ICD-10-CM | POA: Diagnosis not present

## 2014-02-18 DIAGNOSIS — I4891 Unspecified atrial fibrillation: Secondary | ICD-10-CM | POA: Diagnosis not present

## 2014-02-18 DIAGNOSIS — G8254 Quadriplegia, C5-C7 incomplete: Secondary | ICD-10-CM | POA: Diagnosis not present

## 2014-02-18 DIAGNOSIS — S14106S Unspecified injury at C6 level of cervical spinal cord, sequela: Secondary | ICD-10-CM | POA: Diagnosis not present

## 2014-02-20 DIAGNOSIS — S72401D Unspecified fracture of lower end of right femur, subsequent encounter for closed fracture with routine healing: Secondary | ICD-10-CM | POA: Diagnosis not present

## 2014-02-20 DIAGNOSIS — S14106S Unspecified injury at C6 level of cervical spinal cord, sequela: Secondary | ICD-10-CM | POA: Diagnosis not present

## 2014-02-20 DIAGNOSIS — I2699 Other pulmonary embolism without acute cor pulmonale: Secondary | ICD-10-CM | POA: Diagnosis not present

## 2014-02-20 DIAGNOSIS — I4891 Unspecified atrial fibrillation: Secondary | ICD-10-CM | POA: Diagnosis not present

## 2014-02-20 DIAGNOSIS — G8254 Quadriplegia, C5-C7 incomplete: Secondary | ICD-10-CM | POA: Diagnosis not present

## 2014-02-20 DIAGNOSIS — I10 Essential (primary) hypertension: Secondary | ICD-10-CM | POA: Diagnosis not present

## 2014-02-25 DIAGNOSIS — I10 Essential (primary) hypertension: Secondary | ICD-10-CM | POA: Diagnosis not present

## 2014-02-25 DIAGNOSIS — I4891 Unspecified atrial fibrillation: Secondary | ICD-10-CM | POA: Diagnosis not present

## 2014-02-25 DIAGNOSIS — S72401D Unspecified fracture of lower end of right femur, subsequent encounter for closed fracture with routine healing: Secondary | ICD-10-CM | POA: Diagnosis not present

## 2014-02-25 DIAGNOSIS — G8254 Quadriplegia, C5-C7 incomplete: Secondary | ICD-10-CM | POA: Diagnosis not present

## 2014-02-25 DIAGNOSIS — I2699 Other pulmonary embolism without acute cor pulmonale: Secondary | ICD-10-CM | POA: Diagnosis not present

## 2014-02-25 DIAGNOSIS — S14106S Unspecified injury at C6 level of cervical spinal cord, sequela: Secondary | ICD-10-CM | POA: Diagnosis not present

## 2014-02-26 DIAGNOSIS — I2699 Other pulmonary embolism without acute cor pulmonale: Secondary | ICD-10-CM | POA: Diagnosis not present

## 2014-02-26 DIAGNOSIS — K219 Gastro-esophageal reflux disease without esophagitis: Secondary | ICD-10-CM | POA: Diagnosis not present

## 2014-02-26 DIAGNOSIS — E780 Pure hypercholesterolemia: Secondary | ICD-10-CM | POA: Diagnosis not present

## 2014-02-26 DIAGNOSIS — I10 Essential (primary) hypertension: Secondary | ICD-10-CM | POA: Diagnosis not present

## 2014-02-26 DIAGNOSIS — E291 Testicular hypofunction: Secondary | ICD-10-CM | POA: Diagnosis not present

## 2014-02-26 DIAGNOSIS — M159 Polyosteoarthritis, unspecified: Secondary | ICD-10-CM | POA: Diagnosis not present

## 2014-02-26 DIAGNOSIS — G609 Hereditary and idiopathic neuropathy, unspecified: Secondary | ICD-10-CM | POA: Diagnosis not present

## 2014-02-26 DIAGNOSIS — Z681 Body mass index (BMI) 19 or less, adult: Secondary | ICD-10-CM | POA: Diagnosis not present

## 2014-02-26 DIAGNOSIS — M25552 Pain in left hip: Secondary | ICD-10-CM | POA: Diagnosis not present

## 2014-03-05 DIAGNOSIS — G8254 Quadriplegia, C5-C7 incomplete: Secondary | ICD-10-CM | POA: Diagnosis not present

## 2014-03-05 DIAGNOSIS — I2699 Other pulmonary embolism without acute cor pulmonale: Secondary | ICD-10-CM | POA: Diagnosis not present

## 2014-03-05 DIAGNOSIS — I10 Essential (primary) hypertension: Secondary | ICD-10-CM | POA: Diagnosis not present

## 2014-03-05 DIAGNOSIS — I4891 Unspecified atrial fibrillation: Secondary | ICD-10-CM | POA: Diagnosis not present

## 2014-03-05 DIAGNOSIS — S72401D Unspecified fracture of lower end of right femur, subsequent encounter for closed fracture with routine healing: Secondary | ICD-10-CM | POA: Diagnosis not present

## 2014-03-05 DIAGNOSIS — S14106S Unspecified injury at C6 level of cervical spinal cord, sequela: Secondary | ICD-10-CM | POA: Diagnosis not present

## 2014-03-07 DIAGNOSIS — I2699 Other pulmonary embolism without acute cor pulmonale: Secondary | ICD-10-CM | POA: Diagnosis not present

## 2014-03-07 DIAGNOSIS — I4891 Unspecified atrial fibrillation: Secondary | ICD-10-CM | POA: Diagnosis not present

## 2014-03-07 DIAGNOSIS — S14106S Unspecified injury at C6 level of cervical spinal cord, sequela: Secondary | ICD-10-CM | POA: Diagnosis not present

## 2014-03-07 DIAGNOSIS — S72401D Unspecified fracture of lower end of right femur, subsequent encounter for closed fracture with routine healing: Secondary | ICD-10-CM | POA: Diagnosis not present

## 2014-03-07 DIAGNOSIS — I10 Essential (primary) hypertension: Secondary | ICD-10-CM | POA: Diagnosis not present

## 2014-03-07 DIAGNOSIS — G8254 Quadriplegia, C5-C7 incomplete: Secondary | ICD-10-CM | POA: Diagnosis not present

## 2014-03-11 DIAGNOSIS — I2699 Other pulmonary embolism without acute cor pulmonale: Secondary | ICD-10-CM | POA: Diagnosis not present

## 2014-03-11 DIAGNOSIS — I10 Essential (primary) hypertension: Secondary | ICD-10-CM | POA: Diagnosis not present

## 2014-03-11 DIAGNOSIS — S72401D Unspecified fracture of lower end of right femur, subsequent encounter for closed fracture with routine healing: Secondary | ICD-10-CM | POA: Diagnosis not present

## 2014-03-11 DIAGNOSIS — G8254 Quadriplegia, C5-C7 incomplete: Secondary | ICD-10-CM | POA: Diagnosis not present

## 2014-03-11 DIAGNOSIS — I4891 Unspecified atrial fibrillation: Secondary | ICD-10-CM | POA: Diagnosis not present

## 2014-03-11 DIAGNOSIS — S14106S Unspecified injury at C6 level of cervical spinal cord, sequela: Secondary | ICD-10-CM | POA: Diagnosis not present

## 2014-03-15 DIAGNOSIS — G8254 Quadriplegia, C5-C7 incomplete: Secondary | ICD-10-CM | POA: Diagnosis not present

## 2014-03-15 DIAGNOSIS — I4891 Unspecified atrial fibrillation: Secondary | ICD-10-CM | POA: Diagnosis not present

## 2014-03-15 DIAGNOSIS — S14106S Unspecified injury at C6 level of cervical spinal cord, sequela: Secondary | ICD-10-CM | POA: Diagnosis not present

## 2014-03-15 DIAGNOSIS — S72401D Unspecified fracture of lower end of right femur, subsequent encounter for closed fracture with routine healing: Secondary | ICD-10-CM | POA: Diagnosis not present

## 2014-03-15 DIAGNOSIS — I2699 Other pulmonary embolism without acute cor pulmonale: Secondary | ICD-10-CM | POA: Diagnosis not present

## 2014-03-15 DIAGNOSIS — I10 Essential (primary) hypertension: Secondary | ICD-10-CM | POA: Diagnosis not present

## 2014-03-18 DIAGNOSIS — I4891 Unspecified atrial fibrillation: Secondary | ICD-10-CM | POA: Diagnosis not present

## 2014-03-18 DIAGNOSIS — G8254 Quadriplegia, C5-C7 incomplete: Secondary | ICD-10-CM | POA: Diagnosis not present

## 2014-03-18 DIAGNOSIS — I2699 Other pulmonary embolism without acute cor pulmonale: Secondary | ICD-10-CM | POA: Diagnosis not present

## 2014-03-18 DIAGNOSIS — S14106S Unspecified injury at C6 level of cervical spinal cord, sequela: Secondary | ICD-10-CM | POA: Diagnosis not present

## 2014-03-18 DIAGNOSIS — S72401D Unspecified fracture of lower end of right femur, subsequent encounter for closed fracture with routine healing: Secondary | ICD-10-CM | POA: Diagnosis not present

## 2014-03-18 DIAGNOSIS — I10 Essential (primary) hypertension: Secondary | ICD-10-CM | POA: Diagnosis not present

## 2014-03-22 DIAGNOSIS — S72401D Unspecified fracture of lower end of right femur, subsequent encounter for closed fracture with routine healing: Secondary | ICD-10-CM | POA: Diagnosis not present

## 2014-03-22 DIAGNOSIS — I10 Essential (primary) hypertension: Secondary | ICD-10-CM | POA: Diagnosis not present

## 2014-03-22 DIAGNOSIS — I4891 Unspecified atrial fibrillation: Secondary | ICD-10-CM | POA: Diagnosis not present

## 2014-03-22 DIAGNOSIS — I2699 Other pulmonary embolism without acute cor pulmonale: Secondary | ICD-10-CM | POA: Diagnosis not present

## 2014-03-22 DIAGNOSIS — S14106S Unspecified injury at C6 level of cervical spinal cord, sequela: Secondary | ICD-10-CM | POA: Diagnosis not present

## 2014-03-22 DIAGNOSIS — G8254 Quadriplegia, C5-C7 incomplete: Secondary | ICD-10-CM | POA: Diagnosis not present

## 2014-03-26 DIAGNOSIS — S14106S Unspecified injury at C6 level of cervical spinal cord, sequela: Secondary | ICD-10-CM | POA: Diagnosis not present

## 2014-03-26 DIAGNOSIS — I4891 Unspecified atrial fibrillation: Secondary | ICD-10-CM | POA: Diagnosis not present

## 2014-03-26 DIAGNOSIS — S72401D Unspecified fracture of lower end of right femur, subsequent encounter for closed fracture with routine healing: Secondary | ICD-10-CM | POA: Diagnosis not present

## 2014-03-26 DIAGNOSIS — G8254 Quadriplegia, C5-C7 incomplete: Secondary | ICD-10-CM | POA: Diagnosis not present

## 2014-03-26 DIAGNOSIS — I10 Essential (primary) hypertension: Secondary | ICD-10-CM | POA: Diagnosis not present

## 2014-03-26 DIAGNOSIS — I2699 Other pulmonary embolism without acute cor pulmonale: Secondary | ICD-10-CM | POA: Diagnosis not present

## 2014-04-03 DIAGNOSIS — G8254 Quadriplegia, C5-C7 incomplete: Secondary | ICD-10-CM | POA: Diagnosis not present

## 2014-04-03 DIAGNOSIS — I4891 Unspecified atrial fibrillation: Secondary | ICD-10-CM | POA: Diagnosis not present

## 2014-04-03 DIAGNOSIS — I10 Essential (primary) hypertension: Secondary | ICD-10-CM | POA: Diagnosis not present

## 2014-04-03 DIAGNOSIS — I2699 Other pulmonary embolism without acute cor pulmonale: Secondary | ICD-10-CM | POA: Diagnosis not present

## 2014-04-03 DIAGNOSIS — S14106S Unspecified injury at C6 level of cervical spinal cord, sequela: Secondary | ICD-10-CM | POA: Diagnosis not present

## 2014-04-03 DIAGNOSIS — S72401D Unspecified fracture of lower end of right femur, subsequent encounter for closed fracture with routine healing: Secondary | ICD-10-CM | POA: Diagnosis not present

## 2014-04-08 DIAGNOSIS — I4891 Unspecified atrial fibrillation: Secondary | ICD-10-CM | POA: Diagnosis not present

## 2014-04-08 DIAGNOSIS — I10 Essential (primary) hypertension: Secondary | ICD-10-CM | POA: Diagnosis not present

## 2014-04-08 DIAGNOSIS — S14106S Unspecified injury at C6 level of cervical spinal cord, sequela: Secondary | ICD-10-CM | POA: Diagnosis not present

## 2014-04-08 DIAGNOSIS — I2699 Other pulmonary embolism without acute cor pulmonale: Secondary | ICD-10-CM | POA: Diagnosis not present

## 2014-04-08 DIAGNOSIS — G8254 Quadriplegia, C5-C7 incomplete: Secondary | ICD-10-CM | POA: Diagnosis not present

## 2014-04-08 DIAGNOSIS — S72401D Unspecified fracture of lower end of right femur, subsequent encounter for closed fracture with routine healing: Secondary | ICD-10-CM | POA: Diagnosis not present

## 2014-04-10 DIAGNOSIS — S72401D Unspecified fracture of lower end of right femur, subsequent encounter for closed fracture with routine healing: Secondary | ICD-10-CM | POA: Diagnosis not present

## 2014-04-10 DIAGNOSIS — I4891 Unspecified atrial fibrillation: Secondary | ICD-10-CM | POA: Diagnosis not present

## 2014-04-10 DIAGNOSIS — G8254 Quadriplegia, C5-C7 incomplete: Secondary | ICD-10-CM | POA: Diagnosis not present

## 2014-04-10 DIAGNOSIS — I10 Essential (primary) hypertension: Secondary | ICD-10-CM | POA: Diagnosis not present

## 2014-04-10 DIAGNOSIS — Z7901 Long term (current) use of anticoagulants: Secondary | ICD-10-CM | POA: Diagnosis not present

## 2014-04-10 DIAGNOSIS — I2699 Other pulmonary embolism without acute cor pulmonale: Secondary | ICD-10-CM | POA: Diagnosis not present

## 2014-04-10 DIAGNOSIS — S14106S Unspecified injury at C6 level of cervical spinal cord, sequela: Secondary | ICD-10-CM | POA: Diagnosis not present

## 2014-04-15 DIAGNOSIS — I4891 Unspecified atrial fibrillation: Secondary | ICD-10-CM | POA: Diagnosis not present

## 2014-04-15 DIAGNOSIS — Z7901 Long term (current) use of anticoagulants: Secondary | ICD-10-CM | POA: Diagnosis not present

## 2014-04-15 DIAGNOSIS — S14106S Unspecified injury at C6 level of cervical spinal cord, sequela: Secondary | ICD-10-CM | POA: Diagnosis not present

## 2014-04-15 DIAGNOSIS — G8254 Quadriplegia, C5-C7 incomplete: Secondary | ICD-10-CM | POA: Diagnosis not present

## 2014-04-15 DIAGNOSIS — S72401D Unspecified fracture of lower end of right femur, subsequent encounter for closed fracture with routine healing: Secondary | ICD-10-CM | POA: Diagnosis not present

## 2014-04-15 DIAGNOSIS — I2699 Other pulmonary embolism without acute cor pulmonale: Secondary | ICD-10-CM | POA: Diagnosis not present

## 2014-04-22 DIAGNOSIS — I4891 Unspecified atrial fibrillation: Secondary | ICD-10-CM | POA: Diagnosis not present

## 2014-04-22 DIAGNOSIS — S72401D Unspecified fracture of lower end of right femur, subsequent encounter for closed fracture with routine healing: Secondary | ICD-10-CM | POA: Diagnosis not present

## 2014-04-22 DIAGNOSIS — Z7901 Long term (current) use of anticoagulants: Secondary | ICD-10-CM | POA: Diagnosis not present

## 2014-04-22 DIAGNOSIS — I2699 Other pulmonary embolism without acute cor pulmonale: Secondary | ICD-10-CM | POA: Diagnosis not present

## 2014-04-22 DIAGNOSIS — S14106S Unspecified injury at C6 level of cervical spinal cord, sequela: Secondary | ICD-10-CM | POA: Diagnosis not present

## 2014-04-22 DIAGNOSIS — G8254 Quadriplegia, C5-C7 incomplete: Secondary | ICD-10-CM | POA: Diagnosis not present

## 2014-04-23 DIAGNOSIS — K219 Gastro-esophageal reflux disease without esophagitis: Secondary | ICD-10-CM | POA: Diagnosis not present

## 2014-04-23 DIAGNOSIS — I10 Essential (primary) hypertension: Secondary | ICD-10-CM | POA: Diagnosis not present

## 2014-04-23 DIAGNOSIS — E780 Pure hypercholesterolemia: Secondary | ICD-10-CM | POA: Diagnosis not present

## 2014-04-23 DIAGNOSIS — Z681 Body mass index (BMI) 19 or less, adult: Secondary | ICD-10-CM | POA: Diagnosis not present

## 2014-04-23 DIAGNOSIS — E291 Testicular hypofunction: Secondary | ICD-10-CM | POA: Diagnosis not present

## 2014-04-23 DIAGNOSIS — I2699 Other pulmonary embolism without acute cor pulmonale: Secondary | ICD-10-CM | POA: Diagnosis not present

## 2014-04-23 DIAGNOSIS — M159 Polyosteoarthritis, unspecified: Secondary | ICD-10-CM | POA: Diagnosis not present

## 2014-04-23 DIAGNOSIS — M25552 Pain in left hip: Secondary | ICD-10-CM | POA: Diagnosis not present

## 2014-04-23 DIAGNOSIS — G609 Hereditary and idiopathic neuropathy, unspecified: Secondary | ICD-10-CM | POA: Diagnosis not present

## 2014-04-25 DIAGNOSIS — Z7901 Long term (current) use of anticoagulants: Secondary | ICD-10-CM | POA: Diagnosis not present

## 2014-04-25 DIAGNOSIS — I4891 Unspecified atrial fibrillation: Secondary | ICD-10-CM | POA: Diagnosis not present

## 2014-04-25 DIAGNOSIS — S14106S Unspecified injury at C6 level of cervical spinal cord, sequela: Secondary | ICD-10-CM | POA: Diagnosis not present

## 2014-04-25 DIAGNOSIS — G8254 Quadriplegia, C5-C7 incomplete: Secondary | ICD-10-CM | POA: Diagnosis not present

## 2014-04-25 DIAGNOSIS — I2699 Other pulmonary embolism without acute cor pulmonale: Secondary | ICD-10-CM | POA: Diagnosis not present

## 2014-04-25 DIAGNOSIS — S72401D Unspecified fracture of lower end of right femur, subsequent encounter for closed fracture with routine healing: Secondary | ICD-10-CM | POA: Diagnosis not present

## 2014-04-29 DIAGNOSIS — G8254 Quadriplegia, C5-C7 incomplete: Secondary | ICD-10-CM | POA: Diagnosis not present

## 2014-04-29 DIAGNOSIS — S72401D Unspecified fracture of lower end of right femur, subsequent encounter for closed fracture with routine healing: Secondary | ICD-10-CM | POA: Diagnosis not present

## 2014-04-29 DIAGNOSIS — S14106S Unspecified injury at C6 level of cervical spinal cord, sequela: Secondary | ICD-10-CM | POA: Diagnosis not present

## 2014-04-29 DIAGNOSIS — Z7901 Long term (current) use of anticoagulants: Secondary | ICD-10-CM | POA: Diagnosis not present

## 2014-04-29 DIAGNOSIS — I2699 Other pulmonary embolism without acute cor pulmonale: Secondary | ICD-10-CM | POA: Diagnosis not present

## 2014-04-29 DIAGNOSIS — I4891 Unspecified atrial fibrillation: Secondary | ICD-10-CM | POA: Diagnosis not present

## 2014-05-02 DIAGNOSIS — S72401D Unspecified fracture of lower end of right femur, subsequent encounter for closed fracture with routine healing: Secondary | ICD-10-CM | POA: Diagnosis not present

## 2014-05-02 DIAGNOSIS — G8254 Quadriplegia, C5-C7 incomplete: Secondary | ICD-10-CM | POA: Diagnosis not present

## 2014-05-02 DIAGNOSIS — I2699 Other pulmonary embolism without acute cor pulmonale: Secondary | ICD-10-CM | POA: Diagnosis not present

## 2014-05-02 DIAGNOSIS — S14106S Unspecified injury at C6 level of cervical spinal cord, sequela: Secondary | ICD-10-CM | POA: Diagnosis not present

## 2014-05-02 DIAGNOSIS — I4891 Unspecified atrial fibrillation: Secondary | ICD-10-CM | POA: Diagnosis not present

## 2014-05-02 DIAGNOSIS — Z7901 Long term (current) use of anticoagulants: Secondary | ICD-10-CM | POA: Diagnosis not present

## 2014-05-06 DIAGNOSIS — I2699 Other pulmonary embolism without acute cor pulmonale: Secondary | ICD-10-CM | POA: Diagnosis not present

## 2014-05-06 DIAGNOSIS — S72401D Unspecified fracture of lower end of right femur, subsequent encounter for closed fracture with routine healing: Secondary | ICD-10-CM | POA: Diagnosis not present

## 2014-05-06 DIAGNOSIS — Z7901 Long term (current) use of anticoagulants: Secondary | ICD-10-CM | POA: Diagnosis not present

## 2014-05-06 DIAGNOSIS — I4891 Unspecified atrial fibrillation: Secondary | ICD-10-CM | POA: Diagnosis not present

## 2014-05-06 DIAGNOSIS — S14106S Unspecified injury at C6 level of cervical spinal cord, sequela: Secondary | ICD-10-CM | POA: Diagnosis not present

## 2014-05-06 DIAGNOSIS — G8254 Quadriplegia, C5-C7 incomplete: Secondary | ICD-10-CM | POA: Diagnosis not present

## 2014-05-09 DIAGNOSIS — Z7901 Long term (current) use of anticoagulants: Secondary | ICD-10-CM | POA: Diagnosis not present

## 2014-05-09 DIAGNOSIS — S14106S Unspecified injury at C6 level of cervical spinal cord, sequela: Secondary | ICD-10-CM | POA: Diagnosis not present

## 2014-05-09 DIAGNOSIS — I2699 Other pulmonary embolism without acute cor pulmonale: Secondary | ICD-10-CM | POA: Diagnosis not present

## 2014-05-09 DIAGNOSIS — S72401D Unspecified fracture of lower end of right femur, subsequent encounter for closed fracture with routine healing: Secondary | ICD-10-CM | POA: Diagnosis not present

## 2014-05-09 DIAGNOSIS — I4891 Unspecified atrial fibrillation: Secondary | ICD-10-CM | POA: Diagnosis not present

## 2014-05-09 DIAGNOSIS — G8254 Quadriplegia, C5-C7 incomplete: Secondary | ICD-10-CM | POA: Diagnosis not present

## 2014-05-13 DIAGNOSIS — G8254 Quadriplegia, C5-C7 incomplete: Secondary | ICD-10-CM | POA: Diagnosis not present

## 2014-05-13 DIAGNOSIS — S72401D Unspecified fracture of lower end of right femur, subsequent encounter for closed fracture with routine healing: Secondary | ICD-10-CM | POA: Diagnosis not present

## 2014-05-13 DIAGNOSIS — I4891 Unspecified atrial fibrillation: Secondary | ICD-10-CM | POA: Diagnosis not present

## 2014-05-13 DIAGNOSIS — S14106S Unspecified injury at C6 level of cervical spinal cord, sequela: Secondary | ICD-10-CM | POA: Diagnosis not present

## 2014-05-13 DIAGNOSIS — Z7901 Long term (current) use of anticoagulants: Secondary | ICD-10-CM | POA: Diagnosis not present

## 2014-05-13 DIAGNOSIS — I2699 Other pulmonary embolism without acute cor pulmonale: Secondary | ICD-10-CM | POA: Diagnosis not present

## 2014-07-11 DIAGNOSIS — K219 Gastro-esophageal reflux disease without esophagitis: Secondary | ICD-10-CM | POA: Diagnosis not present

## 2014-07-11 DIAGNOSIS — M159 Polyosteoarthritis, unspecified: Secondary | ICD-10-CM | POA: Diagnosis not present

## 2014-07-11 DIAGNOSIS — I10 Essential (primary) hypertension: Secondary | ICD-10-CM | POA: Diagnosis not present

## 2014-07-11 DIAGNOSIS — I2699 Other pulmonary embolism without acute cor pulmonale: Secondary | ICD-10-CM | POA: Diagnosis not present

## 2014-07-11 DIAGNOSIS — E780 Pure hypercholesterolemia: Secondary | ICD-10-CM | POA: Diagnosis not present

## 2014-07-11 DIAGNOSIS — E291 Testicular hypofunction: Secondary | ICD-10-CM | POA: Diagnosis not present

## 2014-07-11 DIAGNOSIS — M25552 Pain in left hip: Secondary | ICD-10-CM | POA: Diagnosis not present

## 2014-07-11 DIAGNOSIS — G609 Hereditary and idiopathic neuropathy, unspecified: Secondary | ICD-10-CM | POA: Diagnosis not present

## 2014-08-09 ENCOUNTER — Encounter (HOSPITAL_COMMUNITY): Payer: Self-pay | Admitting: *Deleted

## 2014-08-09 ENCOUNTER — Emergency Department (HOSPITAL_COMMUNITY)
Admission: EM | Admit: 2014-08-09 | Discharge: 2014-08-10 | Disposition: A | Discharge status: Home / Self Care | Payer: Medicare Other | Attending: Emergency Medicine | Admitting: Emergency Medicine

## 2014-08-09 DIAGNOSIS — Z87828 Personal history of other (healed) physical injury and trauma: Secondary | ICD-10-CM | POA: Diagnosis not present

## 2014-08-09 DIAGNOSIS — Z86711 Personal history of pulmonary embolism: Secondary | ICD-10-CM | POA: Insufficient documentation

## 2014-08-09 DIAGNOSIS — Z79899 Other long term (current) drug therapy: Secondary | ICD-10-CM | POA: Insufficient documentation

## 2014-08-09 DIAGNOSIS — Z7901 Long term (current) use of anticoagulants: Secondary | ICD-10-CM | POA: Diagnosis not present

## 2014-08-09 DIAGNOSIS — N189 Chronic kidney disease, unspecified: Secondary | ICD-10-CM | POA: Insufficient documentation

## 2014-08-09 DIAGNOSIS — Z87442 Personal history of urinary calculi: Secondary | ICD-10-CM | POA: Insufficient documentation

## 2014-08-09 DIAGNOSIS — F419 Anxiety disorder, unspecified: Secondary | ICD-10-CM | POA: Diagnosis not present

## 2014-08-09 DIAGNOSIS — M79672 Pain in left foot: Secondary | ICD-10-CM

## 2014-08-09 DIAGNOSIS — M1990 Unspecified osteoarthritis, unspecified site: Secondary | ICD-10-CM | POA: Diagnosis not present

## 2014-08-09 DIAGNOSIS — I129 Hypertensive chronic kidney disease with stage 1 through stage 4 chronic kidney disease, or unspecified chronic kidney disease: Secondary | ICD-10-CM | POA: Diagnosis not present

## 2014-08-09 DIAGNOSIS — K219 Gastro-esophageal reflux disease without esophagitis: Secondary | ICD-10-CM | POA: Insufficient documentation

## 2014-08-09 DIAGNOSIS — G839 Paralytic syndrome, unspecified: Secondary | ICD-10-CM | POA: Diagnosis not present

## 2014-08-09 DIAGNOSIS — M79605 Pain in left leg: Secondary | ICD-10-CM | POA: Diagnosis not present

## 2014-08-09 DIAGNOSIS — F329 Major depressive disorder, single episode, unspecified: Secondary | ICD-10-CM | POA: Diagnosis not present

## 2014-08-09 DIAGNOSIS — Z86718 Personal history of other venous thrombosis and embolism: Secondary | ICD-10-CM | POA: Insufficient documentation

## 2014-08-09 DIAGNOSIS — I6789 Other cerebrovascular disease: Secondary | ICD-10-CM | POA: Diagnosis not present

## 2014-08-09 HISTORY — DX: Acute embolism and thrombosis of unspecified deep veins of unspecified lower extremity: I82.409

## 2014-08-09 HISTORY — DX: Other pulmonary embolism without acute cor pulmonale: I26.99
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
## 2014-08-09 NOTE — ED Notes (Signed)
Bed: WA21 Expected date:  Expected time:  Means of arrival:  Comments: EMS 30M RLE pain/paralysis/hx of DVT

## 2014-08-10 DIAGNOSIS — M79604 Pain in right leg: Secondary | ICD-10-CM | POA: Diagnosis not present

## 2014-08-10 LAB — CBC WITH DIFFERENTIAL/PLATELET
Basophils Absolute: 0 10*3/uL (ref 0.0–0.1)
Basophils Relative: 0 % (ref 0–1)
Eosinophils Absolute: 0.2 10*3/uL (ref 0.0–0.7)
Eosinophils Relative: 2 % (ref 0–5)
HCT: 41.6 % (ref 39.0–52.0)
HEMOGLOBIN: 13.7 g/dL (ref 13.0–17.0)
Lymphocytes Relative: 18 % (ref 12–46)
Lymphs Abs: 1.7 10*3/uL (ref 0.7–4.0)
MCH: 29.1 pg (ref 26.0–34.0)
MCHC: 32.9 g/dL (ref 30.0–36.0)
MCV: 88.5 fL (ref 78.0–100.0)
MONO ABS: 0.7 10*3/uL (ref 0.1–1.0)
Monocytes Relative: 7 % (ref 3–12)
NEUTROS PCT: 73 % (ref 43–77)
Neutro Abs: 6.9 10*3/uL (ref 1.7–7.7)
PLATELETS: 225 10*3/uL (ref 150–400)
RBC: 4.7 MIL/uL (ref 4.22–5.81)
RDW: 13.4 % (ref 11.5–15.5)
WBC: 9.5 10*3/uL (ref 4.0–10.5)

## 2014-08-10 LAB — I-STAT CHEM 8, ED
BUN: 21 mg/dL — AB (ref 6–20)
CHLORIDE: 102 mmol/L (ref 101–111)
Calcium, Ion: 1.13 mmol/L (ref 1.12–1.23)
Creatinine, Ser: 0.7 mg/dL (ref 0.61–1.24)
GLUCOSE: 120 mg/dL — AB (ref 65–99)
HCT: 43 % (ref 39.0–52.0)
Hemoglobin: 14.6 g/dL (ref 13.0–17.0)
Potassium: 4 mmol/L (ref 3.5–5.1)
SODIUM: 138 mmol/L (ref 135–145)
TCO2: 23 mmol/L (ref 0–100)

## 2014-08-10 LAB — D-DIMER, QUANTITATIVE (NOT AT ARMC): D-Dimer, Quant: 0.35 ug/mL-FEU (ref 0.00–0.48)
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
## 2014-08-10 NOTE — Discharge Instructions (Signed)
Follow time, your examined by this provider your foot pain had resolved.  There was no obvious sign of injury.  No discoloration.  You had good, strong pulses and no swelling.  The specific test for blood clot is negative.  Please make an appointment for follow-up with your primary care provider as needed.  If you develop the same symptoms, worsening pain, persistent pain and swelling.  Please return for further evaluation

## 2014-09-10 DIAGNOSIS — M25552 Pain in left hip: Secondary | ICD-10-CM | POA: Diagnosis not present

## 2014-09-10 DIAGNOSIS — E291 Testicular hypofunction: Secondary | ICD-10-CM | POA: Diagnosis not present

## 2014-09-10 DIAGNOSIS — K219 Gastro-esophageal reflux disease without esophagitis: Secondary | ICD-10-CM | POA: Diagnosis not present

## 2014-09-10 DIAGNOSIS — M159 Polyosteoarthritis, unspecified: Secondary | ICD-10-CM | POA: Diagnosis not present

## 2014-09-10 DIAGNOSIS — G609 Hereditary and idiopathic neuropathy, unspecified: Secondary | ICD-10-CM | POA: Diagnosis not present

## 2014-09-10 DIAGNOSIS — E780 Pure hypercholesterolemia: Secondary | ICD-10-CM | POA: Diagnosis not present

## 2014-09-10 DIAGNOSIS — Z1389 Encounter for screening for other disorder: Secondary | ICD-10-CM | POA: Diagnosis not present

## 2014-09-10 DIAGNOSIS — I10 Essential (primary) hypertension: Secondary | ICD-10-CM | POA: Diagnosis not present

## 2014-09-10 DIAGNOSIS — I2699 Other pulmonary embolism without acute cor pulmonale: Secondary | ICD-10-CM | POA: Diagnosis not present

## 2014-11-12 DIAGNOSIS — I10 Essential (primary) hypertension: Secondary | ICD-10-CM | POA: Diagnosis not present

## 2014-11-12 DIAGNOSIS — E291 Testicular hypofunction: Secondary | ICD-10-CM | POA: Diagnosis not present

## 2014-11-12 DIAGNOSIS — E7800 Pure hypercholesterolemia, unspecified: Secondary | ICD-10-CM | POA: Diagnosis not present

## 2014-11-12 DIAGNOSIS — K219 Gastro-esophageal reflux disease without esophagitis: Secondary | ICD-10-CM | POA: Diagnosis not present

## 2014-11-12 DIAGNOSIS — I2699 Other pulmonary embolism without acute cor pulmonale: Secondary | ICD-10-CM | POA: Diagnosis not present

## 2014-11-12 DIAGNOSIS — M159 Polyosteoarthritis, unspecified: Secondary | ICD-10-CM | POA: Diagnosis not present

## 2014-11-12 DIAGNOSIS — G609 Hereditary and idiopathic neuropathy, unspecified: Secondary | ICD-10-CM | POA: Diagnosis not present

## 2014-11-12 DIAGNOSIS — M25552 Pain in left hip: Secondary | ICD-10-CM | POA: Diagnosis not present

## 2014-12-02 ENCOUNTER — Telehealth: Payer: Self-pay | Admitting: Cardiology

## 2014-12-02 NOTE — Telephone Encounter (Signed)
[facility] with Dr Antoine PocheHochrein on 12/19/14.  [facility] for Dr Hochrein's schedule on 12/19/14. lp

## 2014-12-19 ENCOUNTER — Ambulatory Visit: Payer: Medicare Other | Admitting: Cardiology

## 2014-12-19 DIAGNOSIS — R0989 Other specified symptoms and signs involving the circulatory and respiratory systems: Secondary | ICD-10-CM

## 2015-01-14 DIAGNOSIS — I2699 Other pulmonary embolism without acute cor pulmonale: Secondary | ICD-10-CM | POA: Diagnosis not present

## 2015-01-14 DIAGNOSIS — I10 Essential (primary) hypertension: Secondary | ICD-10-CM | POA: Diagnosis not present

## 2015-01-14 DIAGNOSIS — K219 Gastro-esophageal reflux disease without esophagitis: Secondary | ICD-10-CM | POA: Diagnosis not present

## 2015-01-14 DIAGNOSIS — M25552 Pain in left hip: Secondary | ICD-10-CM | POA: Diagnosis not present

## 2015-01-14 DIAGNOSIS — Z1389 Encounter for screening for other disorder: Secondary | ICD-10-CM | POA: Diagnosis not present

## 2015-01-14 DIAGNOSIS — G609 Hereditary and idiopathic neuropathy, unspecified: Secondary | ICD-10-CM | POA: Diagnosis not present

## 2015-01-14 DIAGNOSIS — M159 Polyosteoarthritis, unspecified: Secondary | ICD-10-CM | POA: Diagnosis not present

## 2015-01-14 DIAGNOSIS — E291 Testicular hypofunction: Secondary | ICD-10-CM | POA: Diagnosis not present

## 2015-01-14 DIAGNOSIS — E7800 Pure hypercholesterolemia, unspecified: Secondary | ICD-10-CM | POA: Diagnosis not present

## 2015-01-27 ENCOUNTER — Ambulatory Visit (INDEPENDENT_AMBULATORY_CARE_PROVIDER_SITE_OTHER): Payer: Medicare Other | Admitting: Cardiology

## 2015-01-27 ENCOUNTER — Encounter: Payer: Self-pay | Admitting: Cardiology

## 2015-01-27 VITALS — BP 130/80 | HR 65 | Ht 72.0 in | Wt 170.0 lb

## 2015-01-27 DIAGNOSIS — R002 Palpitations: Secondary | ICD-10-CM | POA: Diagnosis not present

## 2015-01-27 NOTE — Patient Instructions (Signed)
Your physician recommends that you schedule a follow-up appointment in: As Needed    
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
## 2015-01-28 ENCOUNTER — Ambulatory Visit: Payer: Medicare Other | Admitting: Cardiology

## 2015-03-18 DIAGNOSIS — K219 Gastro-esophageal reflux disease without esophagitis: Secondary | ICD-10-CM | POA: Diagnosis not present

## 2015-03-18 DIAGNOSIS — G609 Hereditary and idiopathic neuropathy, unspecified: Secondary | ICD-10-CM | POA: Diagnosis not present

## 2015-03-18 DIAGNOSIS — I2699 Other pulmonary embolism without acute cor pulmonale: Secondary | ICD-10-CM | POA: Diagnosis not present

## 2015-03-18 DIAGNOSIS — E291 Testicular hypofunction: Secondary | ICD-10-CM | POA: Diagnosis not present

## 2015-03-18 DIAGNOSIS — E7800 Pure hypercholesterolemia, unspecified: Secondary | ICD-10-CM | POA: Diagnosis not present

## 2015-03-18 DIAGNOSIS — I10 Essential (primary) hypertension: Secondary | ICD-10-CM | POA: Diagnosis not present

## 2015-03-18 DIAGNOSIS — Z681 Body mass index (BMI) 19 or less, adult: Secondary | ICD-10-CM | POA: Diagnosis not present

## 2015-03-18 DIAGNOSIS — M159 Polyosteoarthritis, unspecified: Secondary | ICD-10-CM | POA: Diagnosis not present

## 2015-03-18 DIAGNOSIS — M25552 Pain in left hip: Secondary | ICD-10-CM | POA: Diagnosis not present

## 2015-05-13 DIAGNOSIS — E7800 Pure hypercholesterolemia, unspecified: Secondary | ICD-10-CM | POA: Diagnosis not present

## 2015-05-13 DIAGNOSIS — Z681 Body mass index (BMI) 19 or less, adult: Secondary | ICD-10-CM | POA: Diagnosis not present

## 2015-05-13 DIAGNOSIS — K219 Gastro-esophageal reflux disease without esophagitis: Secondary | ICD-10-CM | POA: Diagnosis not present

## 2015-05-13 DIAGNOSIS — G609 Hereditary and idiopathic neuropathy, unspecified: Secondary | ICD-10-CM | POA: Diagnosis not present

## 2015-05-13 DIAGNOSIS — I10 Essential (primary) hypertension: Secondary | ICD-10-CM | POA: Diagnosis not present

## 2015-05-13 DIAGNOSIS — M159 Polyosteoarthritis, unspecified: Secondary | ICD-10-CM | POA: Diagnosis not present

## 2015-05-13 DIAGNOSIS — I2699 Other pulmonary embolism without acute cor pulmonale: Secondary | ICD-10-CM | POA: Diagnosis not present

## 2015-05-13 DIAGNOSIS — E291 Testicular hypofunction: Secondary | ICD-10-CM | POA: Diagnosis not present

## 2015-05-13 DIAGNOSIS — M25552 Pain in left hip: Secondary | ICD-10-CM | POA: Diagnosis not present

## 2015-06-30 ENCOUNTER — Inpatient Hospital Stay (HOSPITAL_COMMUNITY)
Admission: EM | Admit: 2015-06-30 | Discharge: 2015-07-05 | DRG: 853 | Disposition: A | Discharge status: Home Health | Payer: Medicare Other | Attending: Family Medicine | Admitting: Family Medicine

## 2015-06-30 ENCOUNTER — Emergency Department (HOSPITAL_COMMUNITY): Payer: Medicare Other

## 2015-06-30 ENCOUNTER — Encounter (HOSPITAL_COMMUNITY): Payer: Self-pay | Admitting: Emergency Medicine

## 2015-06-30 ENCOUNTER — Inpatient Hospital Stay (HOSPITAL_COMMUNITY): Payer: Medicare Other

## 2015-06-30 DIAGNOSIS — R579 Shock, unspecified: Secondary | ICD-10-CM | POA: Diagnosis present

## 2015-06-30 DIAGNOSIS — E876 Hypokalemia: Secondary | ICD-10-CM | POA: Diagnosis present

## 2015-06-30 DIAGNOSIS — Z86711 Personal history of pulmonary embolism: Secondary | ICD-10-CM | POA: Diagnosis not present

## 2015-06-30 DIAGNOSIS — Z86718 Personal history of other venous thrombosis and embolism: Secondary | ICD-10-CM | POA: Diagnosis not present

## 2015-06-30 DIAGNOSIS — Z7901 Long term (current) use of anticoagulants: Secondary | ICD-10-CM | POA: Diagnosis not present

## 2015-06-30 DIAGNOSIS — N183 Chronic kidney disease, stage 3 unspecified: Secondary | ICD-10-CM

## 2015-06-30 DIAGNOSIS — I248 Other forms of acute ischemic heart disease: Secondary | ICD-10-CM | POA: Diagnosis present

## 2015-06-30 DIAGNOSIS — N200 Calculus of kidney: Secondary | ICD-10-CM | POA: Diagnosis present

## 2015-06-30 DIAGNOSIS — R7881 Bacteremia: Secondary | ICD-10-CM | POA: Diagnosis not present

## 2015-06-30 DIAGNOSIS — R1013 Epigastric pain: Secondary | ICD-10-CM | POA: Diagnosis not present

## 2015-06-30 DIAGNOSIS — K8066 Calculus of gallbladder and bile duct with acute and chronic cholecystitis without obstruction: Secondary | ICD-10-CM | POA: Diagnosis not present

## 2015-06-30 DIAGNOSIS — F418 Other specified anxiety disorders: Secondary | ICD-10-CM | POA: Diagnosis present

## 2015-06-30 DIAGNOSIS — R6521 Severe sepsis with septic shock: Secondary | ICD-10-CM | POA: Diagnosis not present

## 2015-06-30 DIAGNOSIS — E872 Acidosis: Secondary | ICD-10-CM | POA: Diagnosis present

## 2015-06-30 DIAGNOSIS — K8012 Calculus of gallbladder with acute and chronic cholecystitis without obstruction: Secondary | ICD-10-CM | POA: Diagnosis not present

## 2015-06-30 DIAGNOSIS — N189 Chronic kidney disease, unspecified: Secondary | ICD-10-CM | POA: Insufficient documentation

## 2015-06-30 DIAGNOSIS — G8929 Other chronic pain: Secondary | ICD-10-CM | POA: Diagnosis present

## 2015-06-30 DIAGNOSIS — K8000 Calculus of gallbladder with acute cholecystitis without obstruction: Secondary | ICD-10-CM | POA: Diagnosis not present

## 2015-06-30 DIAGNOSIS — I480 Paroxysmal atrial fibrillation: Secondary | ICD-10-CM | POA: Diagnosis present

## 2015-06-30 DIAGNOSIS — K828 Other specified diseases of gallbladder: Secondary | ICD-10-CM | POA: Diagnosis not present

## 2015-06-30 DIAGNOSIS — K449 Diaphragmatic hernia without obstruction or gangrene: Secondary | ICD-10-CM | POA: Diagnosis present

## 2015-06-30 DIAGNOSIS — I10 Essential (primary) hypertension: Secondary | ICD-10-CM | POA: Diagnosis not present

## 2015-06-30 DIAGNOSIS — L89322 Pressure ulcer of left buttock, stage 2: Secondary | ICD-10-CM | POA: Diagnosis not present

## 2015-06-30 DIAGNOSIS — K219 Gastro-esophageal reflux disease without esophagitis: Secondary | ICD-10-CM | POA: Diagnosis present

## 2015-06-30 DIAGNOSIS — G8254 Quadriplegia, C5-C7 incomplete: Secondary | ICD-10-CM | POA: Diagnosis not present

## 2015-06-30 DIAGNOSIS — F32A Depression, unspecified: Secondary | ICD-10-CM | POA: Insufficient documentation

## 2015-06-30 DIAGNOSIS — F419 Anxiety disorder, unspecified: Secondary | ICD-10-CM | POA: Insufficient documentation

## 2015-06-30 DIAGNOSIS — A419 Sepsis, unspecified organism: Secondary | ICD-10-CM | POA: Diagnosis not present

## 2015-06-30 DIAGNOSIS — K810 Acute cholecystitis: Secondary | ICD-10-CM

## 2015-06-30 DIAGNOSIS — A4151 Sepsis due to Escherichia coli [E. coli]: Principal | ICD-10-CM | POA: Diagnosis present

## 2015-06-30 DIAGNOSIS — Z79899 Other long term (current) drug therapy: Secondary | ICD-10-CM

## 2015-06-30 DIAGNOSIS — R479 Unspecified speech disturbances: Secondary | ICD-10-CM | POA: Diagnosis present

## 2015-06-30 DIAGNOSIS — R1110 Vomiting, unspecified: Secondary | ICD-10-CM | POA: Diagnosis not present

## 2015-06-30 DIAGNOSIS — G8250 Quadriplegia, unspecified: Secondary | ICD-10-CM | POA: Diagnosis not present

## 2015-06-30 DIAGNOSIS — I129 Hypertensive chronic kidney disease with stage 1 through stage 4 chronic kidney disease, or unspecified chronic kidney disease: Secondary | ICD-10-CM | POA: Diagnosis present

## 2015-06-30 DIAGNOSIS — F329 Major depressive disorder, single episode, unspecified: Secondary | ICD-10-CM | POA: Insufficient documentation

## 2015-06-30 DIAGNOSIS — K8020 Calculus of gallbladder without cholecystitis without obstruction: Secondary | ICD-10-CM | POA: Diagnosis not present

## 2015-06-30 DIAGNOSIS — K819 Cholecystitis, unspecified: Secondary | ICD-10-CM | POA: Diagnosis not present

## 2015-06-30 DIAGNOSIS — R1011 Right upper quadrant pain: Secondary | ICD-10-CM | POA: Diagnosis not present

## 2015-06-30 HISTORY — DX: Calculus of kidney: N20.0

## 2015-06-30 HISTORY — DX: Reserved for concepts with insufficient information to code with codable children: IMO0002

## 2015-06-30 HISTORY — DX: Other pulmonary embolism without acute cor pulmonale: I26.99

## 2015-06-30 HISTORY — DX: Quadriplegia, C5-C7 incomplete: G82.54

## 2015-06-30 LAB — PROCALCITONIN: Procalcitonin: 23.78 ng/mL

## 2015-06-30 LAB — COMPREHENSIVE METABOLIC PANEL
ALT: 32 U/L (ref 17–63)
AST: 52 U/L — ABNORMAL HIGH (ref 15–41)
Albumin: 4.1 g/dL (ref 3.5–5.0)
Alkaline Phosphatase: 100 U/L (ref 38–126)
Anion gap: 9 (ref 5–15)
BILIRUBIN TOTAL: 2.9 mg/dL — AB (ref 0.3–1.2)
BUN: 16 mg/dL (ref 6–20)
CO2: 28 mmol/L (ref 22–32)
Calcium: 8.9 mg/dL (ref 8.9–10.3)
Chloride: 102 mmol/L (ref 101–111)
Creatinine, Ser: 0.74 mg/dL (ref 0.61–1.24)
Glucose, Bld: 92 mg/dL (ref 65–99)
Potassium: 3.2 mmol/L — ABNORMAL LOW (ref 3.5–5.1)
Sodium: 139 mmol/L (ref 135–145)
Total Protein: 7 g/dL (ref 6.5–8.1)

## 2015-06-30 LAB — URINALYSIS, ROUTINE W REFLEX MICROSCOPIC
GLUCOSE, UA: NEGATIVE mg/dL
HGB URINE DIPSTICK: NEGATIVE
Ketones, ur: NEGATIVE mg/dL
Leukocytes, UA: NEGATIVE
Nitrite: NEGATIVE
PROTEIN: NEGATIVE mg/dL
Specific Gravity, Urine: 1.02 (ref 1.005–1.030)
pH: 7 (ref 5.0–8.0)

## 2015-06-30 LAB — MRSA PCR SCREENING: MRSA by PCR: NEGATIVE

## 2015-06-30 LAB — APTT: aPTT: 34 seconds (ref 24–37)

## 2015-06-30 LAB — CBC WITH DIFFERENTIAL/PLATELET
BASOS PCT: 0 %
Basophils Absolute: 0 10*3/uL (ref 0.0–0.1)
Eosinophils Absolute: 0 10*3/uL (ref 0.0–0.7)
Eosinophils Relative: 0 %
HCT: 41.9 % (ref 39.0–52.0)
Hemoglobin: 14.8 g/dL (ref 13.0–17.0)
Lymphocytes Relative: 7 %
Lymphs Abs: 0.6 10*3/uL — ABNORMAL LOW (ref 0.7–4.0)
MCH: 30.6 pg (ref 26.0–34.0)
MCHC: 35.3 g/dL (ref 30.0–36.0)
MCV: 86.7 fL (ref 78.0–100.0)
MONO ABS: 0.1 10*3/uL (ref 0.1–1.0)
MONOS PCT: 1 %
NEUTROS PCT: 92 %
Neutro Abs: 7.5 10*3/uL (ref 1.7–7.7)
Platelets: 198 10*3/uL (ref 150–400)
RBC: 4.83 MIL/uL (ref 4.22–5.81)
RDW: 13.6 % (ref 11.5–15.5)
WBC: 8.2 10*3/uL (ref 4.0–10.5)

## 2015-06-30 LAB — TROPONIN I: TROPONIN I: 0.04 ng/mL — AB (ref <0.031)

## 2015-06-30 LAB — PROTIME-INR
INR: 1.31 (ref 0.00–1.49)
PROTHROMBIN TIME: 15.9 s — AB (ref 11.6–15.2)

## 2015-06-30 LAB — MAGNESIUM: Magnesium: 1.1 mg/dL — ABNORMAL LOW (ref 1.7–2.4)

## 2015-06-30 LAB — I-STAT CG4 LACTIC ACID, ED
LACTIC ACID, VENOUS: 2.8 mmol/L — AB (ref 0.5–2.0)
LACTIC ACID, VENOUS: 1.92 mmol/L (ref 0.5–2.0)

## 2015-06-30 LAB — LIPASE, BLOOD: LIPASE: 17 U/L (ref 11–51)

## 2015-06-30 LAB — CORTISOL: Cortisol, Plasma: 8 ug/dL

## 2015-06-30 MED ORDER — ONDANSETRON HCL 4 MG/2ML IJ SOLN
4.0000 mg | Freq: Once | INTRAMUSCULAR | Status: AC
  Administered 2015-06-30: 4 mg via INTRAVENOUS
  Filled 2015-06-30: qty 2

## 2015-06-30 MED ORDER — ACETAMINOPHEN 325 MG PO TABS
650.0000 mg | ORAL_TABLET | Freq: Once | ORAL | Status: AC
  Administered 2015-06-30: 650 mg via ORAL
  Filled 2015-06-30: qty 2

## 2015-06-30 MED ORDER — VANCOMYCIN HCL IN DEXTROSE 1-5 GM/200ML-% IV SOLN
1000.0000 mg | Freq: Two times a day (BID) | INTRAVENOUS | Status: DC
  Administered 2015-07-01: 1000 mg via INTRAVENOUS
  Filled 2015-06-30: qty 200

## 2015-06-30 MED ORDER — CETYLPYRIDINIUM CHLORIDE 0.05 % MT LIQD
7.0000 mL | Freq: Two times a day (BID) | OROMUCOSAL | Status: DC
  Administered 2015-07-01 – 2015-07-04 (×4): 7 mL via OROMUCOSAL

## 2015-06-30 MED ORDER — CHLORHEXIDINE GLUCONATE 4 % EX LIQD
1.0000 "application " | Freq: Once | CUTANEOUS | Status: AC
  Administered 2015-07-01: 1 via TOPICAL
  Filled 2015-06-30: qty 15

## 2015-06-30 MED ORDER — SODIUM CHLORIDE 0.9 % IV BOLUS (SEPSIS)
1000.0000 mL | Freq: Once | INTRAVENOUS | Status: AC
  Administered 2015-06-30: 1000 mL via INTRAVENOUS

## 2015-06-30 MED ORDER — PIPERACILLIN-TAZOBACTAM 3.375 G IVPB
3.3750 g | Freq: Three times a day (TID) | INTRAVENOUS | Status: DC
  Administered 2015-06-30 – 2015-07-03 (×8): 3.375 g via INTRAVENOUS
  Filled 2015-06-30 (×7): qty 50

## 2015-06-30 MED ORDER — SODIUM CHLORIDE 0.9 % IV BOLUS (SEPSIS)
2000.0000 mL | Freq: Once | INTRAVENOUS | Status: AC
  Administered 2015-06-30: 2000 mL via INTRAVENOUS

## 2015-06-30 MED ORDER — FENTANYL CITRATE (PF) 100 MCG/2ML IJ SOLN
12.5000 ug | INTRAMUSCULAR | Status: DC | PRN
  Administered 2015-07-01 – 2015-07-04 (×8): 25 ug via INTRAVENOUS
  Filled 2015-06-30 (×8): qty 2

## 2015-06-30 MED ORDER — FAMOTIDINE IN NACL 20-0.9 MG/50ML-% IV SOLN
20.0000 mg | Freq: Two times a day (BID) | INTRAVENOUS | Status: DC
  Administered 2015-06-30: 20 mg via INTRAVENOUS
  Filled 2015-06-30: qty 50

## 2015-06-30 MED ORDER — PIPERACILLIN-TAZOBACTAM 3.375 G IVPB 30 MIN
3.3750 g | Freq: Once | INTRAVENOUS | Status: AC
  Administered 2015-06-30: 3.375 g via INTRAVENOUS
  Filled 2015-06-30: qty 50

## 2015-06-30 MED ORDER — IOPAMIDOL (ISOVUE-300) INJECTION 61%
100.0000 mL | Freq: Once | INTRAVENOUS | Status: AC | PRN
  Administered 2015-06-30: 100 mL via INTRAVENOUS

## 2015-06-30 MED ORDER — PHENYLEPHRINE HCL 10 MG/ML IJ SOLN
0.0000 ug/min | INTRAMUSCULAR | Status: DC
  Administered 2015-06-30: 10 ug/min via INTRAVENOUS
  Administered 2015-06-30: 20 ug/min via INTRAVENOUS
  Administered 2015-07-01: 40 ug/min via INTRAVENOUS
  Filled 2015-06-30 (×5): qty 1

## 2015-06-30 MED ORDER — FENTANYL CITRATE (PF) 100 MCG/2ML IJ SOLN
50.0000 ug | Freq: Once | INTRAMUSCULAR | Status: AC
Start: 2015-06-30 — End: 2015-06-30
  Administered 2015-06-30: 50 ug via INTRAVENOUS
  Filled 2015-06-30: qty 2

## 2015-06-30 MED ORDER — LACTATED RINGERS IV BOLUS (SEPSIS)
2000.0000 mL | Freq: Once | INTRAVENOUS | Status: AC
  Administered 2015-06-30: 2000 mL via INTRAVENOUS

## 2015-06-30 MED ORDER — SODIUM CHLORIDE 0.9 % IV BOLUS (SEPSIS)
250.0000 mL | Freq: Once | INTRAVENOUS | Status: AC
  Administered 2015-06-30: 250 mL via INTRAVENOUS

## 2015-06-30 MED ORDER — ONDANSETRON HCL 4 MG/2ML IJ SOLN: 4.0000 mg | Freq: Four times a day (QID) | INTRAMUSCULAR | Status: DC | PRN

## 2015-06-30 MED ORDER — MAGNESIUM SULFATE 2 GM/50ML IV SOLN
2.0000 g | Freq: Once | INTRAVENOUS | Status: AC
  Administered 2015-06-30: 2 g via INTRAVENOUS
  Filled 2015-06-30: qty 50

## 2015-06-30 MED ORDER — FENTANYL CITRATE (PF) 100 MCG/2ML IJ SOLN
50.0000 ug | Freq: Once | INTRAMUSCULAR | Status: AC
  Administered 2015-06-30: 50 ug via INTRAVENOUS
  Filled 2015-06-30: qty 2

## 2015-06-30 MED ORDER — SODIUM CHLORIDE 0.9 % IV SOLN
250.0000 mL | INTRAVENOUS | Status: DC | PRN
  Administered 2015-06-30: 250 mL via INTRAVENOUS

## 2015-06-30 MED ORDER — CHLORHEXIDINE GLUCONATE 0.12 % MT SOLN
15.0000 mL | Freq: Two times a day (BID) | OROMUCOSAL | Status: DC
  Administered 2015-06-30 – 2015-07-05 (×9): 15 mL via OROMUCOSAL
  Filled 2015-06-30 (×8): qty 15

## 2015-06-30 MED ORDER — VANCOMYCIN HCL IN DEXTROSE 1-5 GM/200ML-% IV SOLN
1000.0000 mg | INTRAVENOUS | Status: AC
  Administered 2015-06-30: 1000 mg via INTRAVENOUS
  Filled 2015-06-30: qty 200

## 2015-06-30 MED ORDER — CHLORHEXIDINE GLUCONATE 4 % EX LIQD
1.0000 "application " | Freq: Once | CUTANEOUS | Status: AC
  Administered 2015-07-02: 1 via TOPICAL
  Filled 2015-06-30: qty 15

## 2015-06-30 MED ORDER — NOREPINEPHRINE BITARTRATE 1 MG/ML IV SOLN
0.0000 ug/min | Freq: Once | INTRAVENOUS | Status: AC
  Administered 2015-06-30: 2 ug/min via INTRAVENOUS
  Filled 2015-06-30: qty 4

## 2015-06-30 MED ORDER — POTASSIUM CHLORIDE 10 MEQ/100ML IV SOLN
10.0000 meq | INTRAVENOUS | Status: AC
  Administered 2015-06-30 (×2): 10 meq via INTRAVENOUS
  Filled 2015-06-30 (×2): qty 100
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
## 2015-06-30 NOTE — ED Notes (Signed)
Pt to go up 1930

## 2015-06-30 NOTE — ED Notes (Signed)
Bed: [id number] Expected date:  Expected time:  Means of arrival:  Comments: 8M/bed bound/fever/tachy

## 2015-06-30 NOTE — Progress Notes (Signed)
eLink Physician-Brief Progress Note Patient Name: Lucas Harper DOB: [birth year] MRN: [id number]   Date of Service  06/30/2015  HPI/Events of Note  Multiple issues: 1. Lactic Acid level = 2.8 >> 1.92 and 2. Mg++ = 1.1. No Creatinine available.   eICU Interventions  Will replete Mg++.     Intervention Category Major Interventions: Acid-Base disturbance - evaluation and management Intermediate Interventions: Electrolyte abnormality - evaluation and management  Sommer,Steven Navon 06/30/2015, 9:42 PM

## 2015-06-30 NOTE — ED Notes (Signed)
Dr. Roselyn BeringJ.Knapp notified of patients lactic acid result.

## 2015-06-30 NOTE — ED Notes (Signed)
Bolus continues to infuse.  
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
## 2015-06-30 NOTE — Progress Notes (Signed)
Pharmacy Antibiotic Note  Lucas Harper is a 60 y.o. male admitted on 06/30/2015 with IAI.  Pharmacy has been consulted for Zosyn dosing. 06/30/2015:   Afebrile  No leukocytosis, but elevated LA (2.8)  Hypotensive, tachycardic  Scr (0.74) at patient's baseline.  CrCl >820ml/min.   Plan: Zosyn 3.375g IV q8h (4 hour infusion).  Monitor renal function and cx data   Weight: 165 lb (74.844 kg)  Temp (24hrs), Avg:99.8 F (37.7 C), Min:99.8 F (37.7 C), Max:99.8 F (37.7 C)   Recent Labs Lab 06/30/15 1200 06/30/15 1210  WBC 8.2  --   LATICACIDVEN  --  2.80*    CrCl cannot be calculated (Patient has no serum creatinine result on file.).    No Known Allergies  Antimicrobials this admission: Zosyn 5/22 >>   Dose adjustments this admission:  Microbiology results: 5/22 BCx: sent 5/22 UCx: ordered   Thank you for allowing pharmacy to be a part of this patient's care.  Elson ClanLilliston, Adreyan Carbajal Michelle 06/30/2015 12:41 PM

## 2015-06-30 NOTE — ED Notes (Signed)
Per EMS. Pt from home. Pt has limited movement in all extremities and trouble speaking which is normal for him. Pt has felt unwell for the past week. Yesterday had 1 episode of loose stools which is unusual for him. Started having abd pain just above belly button at 0400 that was relieved after throwing up. Denies pain at present. No pulsating abd masses. Feet discolored, but is normal for him, pedal pulses present.

## 2015-06-30 NOTE — ED Notes (Signed)
MD at bedside. 

## 2015-06-30 NOTE — ED Notes (Signed)
MD at bedside. 1L NS bolus started

## 2015-06-30 NOTE — ED Notes (Signed)
MD aware of BP

## 2015-06-30 NOTE — ED Notes (Signed)
Patient transported to CT 

## 2015-06-30 NOTE — Progress Notes (Signed)
Pharmacy Antibiotic Note  Lucas Harper is a 60 y.o. male admitted on 06/30/2015 with IAI.  Pharmacy has been consulted for Zosyn dosing.  Now Vancomycin added on tonight as well for septic shock with acute cholecystitis.    -Note quadriplegia, CrCl likely overestimated  Plan: Vancomycin 1g IV q12h Continue Zosyn 3.375g IV q8h (infuse over 4 hours) Monitor renal function and cx data  Check VT at Css as warranted  Weight: 165 lb (74.844 kg)  Temp (24hrs), Avg:100.2 F (37.9 C), Min:99.6 F (37.6 C), Max:101.1 F (38.4 C)   Recent Labs Lab 06/30/15 1200 06/30/15 1210 06/30/15 1505  WBC 8.2  --   --   CREATININE 0.74  --   --   LATICACIDVEN  --  2.80* 1.92    Estimated Creatinine Clearance: 106.5 mL/min (by C-G formula based on Cr of 0.74).    No Known Allergies  Antimicrobials this admission: Zosyn 5/22 >>  Vancomycin 5/22 >>  Dose adjustments this admission:  Microbiology results: 5/22 BCx: sent 5/22 UCx: ordered   Thank you for allowing pharmacy to be a part of this patient's care.  Haynes Hoehnolleen Avien Taha, PharmD, BCPS 06/30/2015, 6:20 PM  Pager: [phone]
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
## 2015-07-01 ENCOUNTER — Inpatient Hospital Stay (HOSPITAL_COMMUNITY): Payer: Medicare Other

## 2015-07-01 DIAGNOSIS — R579 Shock, unspecified: Secondary | ICD-10-CM

## 2015-07-01 DIAGNOSIS — G8250 Quadriplegia, unspecified: Secondary | ICD-10-CM

## 2015-07-01 DIAGNOSIS — R7881 Bacteremia: Secondary | ICD-10-CM

## 2015-07-01 LAB — URINE CULTURE: Culture: NO GROWTH

## 2015-07-01 LAB — ECHOCARDIOGRAM COMPLETE
HEIGHTINCHES: 72 in
WEIGHTICAEL: 2783.09 [oz_av]

## 2015-07-01 LAB — BLOOD CULTURE ID PANEL (REFLEXED)
ACINETOBACTER BAUMANNII: NOT DETECTED
CARBAPENEM RESISTANCE: NOT DETECTED
Candida albicans: NOT DETECTED
Candida glabrata: NOT DETECTED
Candida krusei: NOT DETECTED
Candida parapsilosis: NOT DETECTED
Candida tropicalis: NOT DETECTED
ESCHERICHIA COLI: DETECTED — AB
Enterobacter cloacae complex: NOT DETECTED
Enterobacteriaceae species: DETECTED — AB
Enterococcus species: NOT DETECTED
HAEMOPHILUS INFLUENZAE: NOT DETECTED
Klebsiella oxytoca: NOT DETECTED
Klebsiella pneumoniae: NOT DETECTED
LISTERIA MONOCYTOGENES: NOT DETECTED
METHICILLIN RESISTANCE: NOT DETECTED
NEISSERIA MENINGITIDIS: NOT DETECTED
Proteus species: NOT DETECTED
Pseudomonas aeruginosa: NOT DETECTED
SERRATIA MARCESCENS: NOT DETECTED
STAPHYLOCOCCUS SPECIES: NOT DETECTED
STREPTOCOCCUS PYOGENES: NOT DETECTED
STREPTOCOCCUS SPECIES: NOT DETECTED
Staphylococcus aureus (BCID): NOT DETECTED
Streptococcus agalactiae: NOT DETECTED
Streptococcus pneumoniae: NOT DETECTED
Vancomycin resistance: NOT DETECTED

## 2015-07-01 LAB — CBC WITH DIFFERENTIAL/PLATELET
BASOS ABS: 0 10*3/uL (ref 0.0–0.1)
Basophils Relative: 0 %
EOS ABS: 0.1 10*3/uL (ref 0.0–0.7)
EOS PCT: 1 %
HCT: 35 % — ABNORMAL LOW (ref 39.0–52.0)
Hemoglobin: 11.9 g/dL — ABNORMAL LOW (ref 13.0–17.0)
LYMPHS PCT: 9 %
Lymphs Abs: 1.2 10*3/uL (ref 0.7–4.0)
MCH: 30.4 pg (ref 26.0–34.0)
MCHC: 34 g/dL (ref 30.0–36.0)
MCV: 89.5 fL (ref 78.0–100.0)
MONO ABS: 1 10*3/uL (ref 0.1–1.0)
Monocytes Relative: 7 %
Neutro Abs: 12 10*3/uL — ABNORMAL HIGH (ref 1.7–7.7)
Neutrophils Relative %: 83 %
PLATELETS: 212 10*3/uL (ref 150–400)
RBC: 3.91 MIL/uL — ABNORMAL LOW (ref 4.22–5.81)
RDW: 14.4 % (ref 11.5–15.5)
WBC: 14.4 10*3/uL — ABNORMAL HIGH (ref 4.0–10.5)

## 2015-07-01 LAB — PROTIME-INR
INR: 1.37 (ref 0.00–1.49)
Prothrombin Time: 16.4 seconds — ABNORMAL HIGH (ref 11.6–15.2)

## 2015-07-01 LAB — COMPREHENSIVE METABOLIC PANEL
ALBUMIN: 2.7 g/dL — AB (ref 3.5–5.0)
ALT: 26 U/L (ref 17–63)
ANION GAP: 5 (ref 5–15)
AST: 31 U/L (ref 15–41)
Alkaline Phosphatase: 64 U/L (ref 38–126)
BILIRUBIN TOTAL: 1.5 mg/dL — AB (ref 0.3–1.2)
BUN: 9 mg/dL (ref 6–20)
CHLORIDE: 106 mmol/L (ref 101–111)
CO2: 23 mmol/L (ref 22–32)
Calcium: 7.4 mg/dL — ABNORMAL LOW (ref 8.9–10.3)
Creatinine, Ser: 0.68 mg/dL (ref 0.61–1.24)
GFR calc Af Amer: >60 mL/min (ref >60)
GFR calc non Af Amer: >60 mL/min (ref >60)
GLUCOSE: 97 mg/dL (ref 65–99)
POTASSIUM: 3.7 mmol/L (ref 3.5–5.1)
SODIUM: 134 mmol/L — AB (ref 135–145)
TOTAL PROTEIN: 4.9 g/dL — AB (ref 6.5–8.1)

## 2015-07-01 LAB — TROPONIN I
TROPONIN I: 0.11 ng/mL — AB (ref <0.031)
Troponin I: 0.09 ng/mL — ABNORMAL HIGH (ref <0.031)

## 2015-07-01 LAB — MAGNESIUM: Magnesium: 1.8 mg/dL (ref 1.7–2.4)

## 2015-07-01 LAB — PROCALCITONIN: PROCALCITONIN: 24.06 ng/mL

## 2015-07-01 LAB — APTT: APTT: 39 s — AB (ref 24–37)

## 2015-07-01 LAB — PHOSPHORUS: Phosphorus: 1.8 mg/dL — ABNORMAL LOW (ref 2.5–4.6)

## 2015-07-01 MED ORDER — SODIUM PHOSPHATES 45 MMOLE/15ML IV SOLN
10.0000 mmol | Freq: Once | INTRAVENOUS | Status: AC
  Administered 2015-07-01: 10 mmol via INTRAVENOUS
  Filled 2015-07-01: qty 3.33

## 2015-07-01 MED ORDER — DEXTROSE-NACL 5-0.9 % IV SOLN
INTRAVENOUS | Status: DC
  Administered 2015-07-01 – 2015-07-04 (×5): via INTRAVENOUS

## 2015-07-01 MED ORDER — METHOCARBAMOL 1000 MG/10ML IJ SOLN
1000.0000 mg | Freq: Four times a day (QID) | INTRAMUSCULAR | Status: DC | PRN
  Filled 2015-07-01: qty 10

## 2015-07-01 MED ORDER — PERFLUTREN LIPID MICROSPHERE
1.0000 mL | INTRAVENOUS | Status: AC | PRN
  Administered 2015-07-01: 2 mL via INTRAVENOUS
  Filled 2015-07-01: qty 10

## 2015-07-01 MED ORDER — PHENYLEPHRINE HCL 10 MG/ML IJ SOLN
0.0000 ug/min | INTRAVENOUS | Status: DC
  Administered 2015-07-01: 30 ug/min via INTRAVENOUS
  Administered 2015-07-02: 8 ug/min via INTRAVENOUS
  Filled 2015-07-01 (×2): qty 2

## 2015-07-01 MED ORDER — POTASSIUM CHLORIDE 10 MEQ/100ML IV SOLN
10.0000 meq | INTRAVENOUS | Status: AC
  Administered 2015-07-01 (×2): 10 meq via INTRAVENOUS
  Filled 2015-07-01 (×2): qty 100

## 2015-07-01 MED ORDER — PANTOPRAZOLE SODIUM 40 MG IV SOLR
40.0000 mg | INTRAVENOUS | Status: DC
  Administered 2015-07-01: 40 mg via INTRAVENOUS
  Filled 2015-07-01: qty 40

## 2015-07-01 MED ORDER — ACETAMINOPHEN 500 MG PO TABS
1000.0000 mg | ORAL_TABLET | Freq: Three times a day (TID) | ORAL | Status: DC
Start: 2015-07-01 — End: 2015-07-05
  Administered 2015-07-01 – 2015-07-04 (×11): 1000 mg via ORAL
  Filled 2015-07-01 (×16): qty 2
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
## 2015-07-01 NOTE — Care Management Note (Signed)
Case Management Note  Patient Details  Name: Lucas Harper MRN: [id number] Date of Birth: 08-Dec-1955  Subjective/Objective:        sepsis            Action/Plan: Date:  Jul 01, 2015 Chart reviewed for concurrent status and case management needs. Will continue to follow patient for changes and needs: Expected discharge date: [id number] Marcelle SmilingRhonda Kelcy Laible, BSN, [place]3, [place]   [phone]  Expected Discharge Date:   Crissie Figures(UNKNOWN\)               Expected Discharge Plan:  Home/Self Care  In-House Referral:  NA  Discharge planning Services  CM Consult  Post Acute Care Choice:  NA Choice offered to:  NA  DME Arranged:    DME Agency:     HH Arranged:    HH Agency:     Status of Service:  In process, will continue to follow  Medicare Important Message Given:    Date Medicare IM Given:    Medicare IM give by:    Date Additional Medicare IM Given:    Additional Medicare Important Message give by:     If discussed at Long Length of Stay Meetings, dates discussed:    Additional Comments:  Golda AcreDavis, Ed Mandich Lynn, RN 07/01/2015, 11:18 AM

## 2015-07-02 ENCOUNTER — Inpatient Hospital Stay (HOSPITAL_COMMUNITY): Payer: Medicare Other | Admitting: Anesthesiology

## 2015-07-02 ENCOUNTER — Encounter (HOSPITAL_COMMUNITY): Payer: Self-pay | Admitting: Certified Registered"

## 2015-07-02 ENCOUNTER — Encounter (HOSPITAL_COMMUNITY)
Admission: EM | Disposition: A | Discharge status: Home Health | Payer: Self-pay | Source: Home / Self Care | Attending: Pulmonary Disease

## 2015-07-02 ENCOUNTER — Inpatient Hospital Stay (HOSPITAL_COMMUNITY): Payer: Medicare Other

## 2015-07-02 DIAGNOSIS — A4151 Sepsis due to Escherichia coli [E. coli]: Principal | ICD-10-CM

## 2015-07-02 HISTORY — PX: CHOLECYSTECTOMY: SHX55

## 2015-07-02 LAB — BASIC METABOLIC PANEL
Anion gap: 4 — ABNORMAL LOW (ref 5–15)
BUN: 6 mg/dL (ref 6–20)
CHLORIDE: 111 mmol/L (ref 101–111)
CO2: 25 mmol/L (ref 22–32)
CREATININE: 0.62 mg/dL (ref 0.61–1.24)
Calcium: 7.8 mg/dL — ABNORMAL LOW (ref 8.9–10.3)
GFR calc non Af Amer: >60 mL/min (ref >60)
Glucose, Bld: 100 mg/dL — ABNORMAL HIGH (ref 65–99)
Potassium: 3.4 mmol/L — ABNORMAL LOW (ref 3.5–5.1)
SODIUM: 140 mmol/L (ref 135–145)

## 2015-07-02 LAB — CBC
HEMATOCRIT: 34.9 % — AB (ref 39.0–52.0)
HEMOGLOBIN: 12 g/dL — AB (ref 13.0–17.0)
MCH: 30.2 pg (ref 26.0–34.0)
MCHC: 34.4 g/dL (ref 30.0–36.0)
MCV: 87.9 fL (ref 78.0–100.0)
Platelets: 174 10*3/uL (ref 150–400)
RBC: 3.97 MIL/uL — AB (ref 4.22–5.81)
RDW: 14.1 % (ref 11.5–15.5)
WBC: 7.3 10*3/uL (ref 4.0–10.5)

## 2015-07-02 LAB — MAGNESIUM: Magnesium: 1.7 mg/dL (ref 1.7–2.4)

## 2015-07-02 LAB — PHOSPHORUS: Phosphorus: 2.4 mg/dL — ABNORMAL LOW (ref 2.5–4.6)

## 2015-07-02 LAB — PROCALCITONIN: PROCALCITONIN: 18.57 ng/mL

## 2015-07-02 SURGERY — LAPAROSCOPIC CHOLECYSTECTOMY WITH INTRAOPERATIVE CHOLANGIOGRAM
Anesthesia: General | Site: Abdomen

## 2015-07-02 MED ORDER — LACTATED RINGERS IV SOLN
INTRAVENOUS | Status: DC
  Administered 2015-07-02 (×2): via INTRAVENOUS

## 2015-07-02 MED ORDER — IOPAMIDOL (ISOVUE-300) INJECTION 61%
INTRAVENOUS | Status: AC
  Filled 2015-07-02: qty 50

## 2015-07-02 MED ORDER — FENTANYL CITRATE (PF) 100 MCG/2ML IJ SOLN
INTRAMUSCULAR | Status: DC | PRN
  Administered 2015-07-02 (×5): 50 ug via INTRAVENOUS

## 2015-07-02 MED ORDER — FENTANYL CITRATE (PF) 100 MCG/2ML IJ SOLN: 25.0000 ug | INTRAMUSCULAR | Status: DC | PRN

## 2015-07-02 MED ORDER — IOPAMIDOL (ISOVUE-300) INJECTION 61%
INTRAVENOUS | Status: DC | PRN
  Administered 2015-07-02: 7 mL

## 2015-07-02 MED ORDER — ROCURONIUM BROMIDE 100 MG/10ML IV SOLN
INTRAVENOUS | Status: AC
  Filled 2015-07-02: qty 1

## 2015-07-02 MED ORDER — LIDOCAINE HCL (CARDIAC) 20 MG/ML IV SOLN
INTRAVENOUS | Status: AC
  Filled 2015-07-02: qty 5

## 2015-07-02 MED ORDER — ROCURONIUM BROMIDE 100 MG/10ML IV SOLN
INTRAVENOUS | Status: DC | PRN
  Administered 2015-07-02: 60 mg via INTRAVENOUS

## 2015-07-02 MED ORDER — 0.9 % SODIUM CHLORIDE (POUR BTL) OPTIME
TOPICAL | Status: DC | PRN
  Administered 2015-07-02: 2000 mL

## 2015-07-02 MED ORDER — PROPOFOL 10 MG/ML IV BOLUS
INTRAVENOUS | Status: DC | PRN
  Administered 2015-07-02: 130 mg via INTRAVENOUS

## 2015-07-02 MED ORDER — LACTATED RINGERS IR SOLN
Status: DC | PRN
  Administered 2015-07-02: 1000 mL

## 2015-07-02 MED ORDER — BUPIVACAINE-EPINEPHRINE 0.25% -1:200000 IJ SOLN
INTRAMUSCULAR | Status: AC
  Filled 2015-07-02: qty 1

## 2015-07-02 MED ORDER — MIDAZOLAM HCL 2 MG/2ML IJ SOLN
INTRAMUSCULAR | Status: AC
  Filled 2015-07-02: qty 2

## 2015-07-02 MED ORDER — HYDROCODONE-ACETAMINOPHEN 10-325 MG PO TABS
1.0000 | ORAL_TABLET | Freq: Four times a day (QID) | ORAL | Status: DC | PRN
  Administered 2015-07-05: 1 via ORAL
  Filled 2015-07-02 (×2): qty 1

## 2015-07-02 MED ORDER — PROPOFOL 10 MG/ML IV BOLUS
INTRAVENOUS | Status: AC
  Filled 2015-07-02: qty 20

## 2015-07-02 MED ORDER — HYDROCODONE-ACETAMINOPHEN 10-325 MG PO TABS: 1.0000 | ORAL_TABLET | Freq: Four times a day (QID) | ORAL | Status: AC | PRN

## 2015-07-02 MED ORDER — LIDOCAINE HCL (CARDIAC) 20 MG/ML IV SOLN
INTRAVENOUS | Status: DC | PRN
  Administered 2015-07-02: 50 mg via INTRAVENOUS

## 2015-07-02 MED ORDER — MIDAZOLAM HCL 5 MG/5ML IJ SOLN
INTRAMUSCULAR | Status: DC | PRN
  Administered 2015-07-02: 2 mg via INTRAVENOUS

## 2015-07-02 MED ORDER — MAGNESIUM SULFATE 2 GM/50ML IV SOLN
2.0000 g | Freq: Once | INTRAVENOUS | Status: AC
  Administered 2015-07-02: 2 g via INTRAVENOUS
  Filled 2015-07-02: qty 50

## 2015-07-02 MED ORDER — FENTANYL CITRATE (PF) 250 MCG/5ML IJ SOLN
INTRAMUSCULAR | Status: AC
  Filled 2015-07-02: qty 5

## 2015-07-02 MED ORDER — SUGAMMADEX SODIUM 200 MG/2ML IV SOLN
INTRAVENOUS | Status: AC
  Filled 2015-07-02: qty 2

## 2015-07-02 MED ORDER — DEXAMETHASONE SODIUM PHOSPHATE 10 MG/ML IJ SOLN
INTRAMUSCULAR | Status: AC
  Filled 2015-07-02: qty 1

## 2015-07-02 MED ORDER — BUPIVACAINE-EPINEPHRINE 0.25% -1:200000 IJ SOLN
INTRAMUSCULAR | Status: DC | PRN
  Administered 2015-07-02: 17 mL

## 2015-07-02 MED ORDER — POTASSIUM CHLORIDE 10 MEQ/100ML IV SOLN
10.0000 meq | INTRAVENOUS | Status: AC
  Administered 2015-07-02 (×2): 10 meq via INTRAVENOUS
  Filled 2015-07-02 (×2): qty 100

## 2015-07-02 MED ORDER — SODIUM CHLORIDE 0.9 % IV BOLUS (SEPSIS)
1000.0000 mL | Freq: Once | INTRAVENOUS | Status: AC
  Administered 2015-07-02: 1000 mL via INTRAVENOUS

## 2015-07-02 MED ORDER — PIPERACILLIN-TAZOBACTAM 3.375 G IVPB
INTRAVENOUS | Status: AC
  Filled 2015-07-02: qty 50

## 2015-07-02 MED ORDER — ONDANSETRON HCL 4 MG/2ML IJ SOLN: 4.0000 mg | Freq: Once | INTRAMUSCULAR | Status: DC | PRN

## 2015-07-02 MED ORDER — ONDANSETRON HCL 4 MG/2ML IJ SOLN
INTRAMUSCULAR | Status: DC | PRN
  Administered 2015-07-02: 4 mg via INTRAVENOUS

## 2015-07-02 MED ORDER — PHENYLEPHRINE 40 MCG/ML (10ML) SYRINGE FOR IV PUSH (FOR BLOOD PRESSURE SUPPORT)
PREFILLED_SYRINGE | INTRAVENOUS | Status: AC
  Filled 2015-07-02: qty 10

## 2015-07-02 MED ORDER — PHENYLEPHRINE HCL 10 MG/ML IJ SOLN
INTRAMUSCULAR | Status: DC | PRN
  Administered 2015-07-02: 40 ug via INTRAVENOUS

## 2015-07-02 MED ORDER — HYDROCODONE-ACETAMINOPHEN 7.5-325 MG PO TABS: 2.0000 | ORAL_TABLET | Freq: Once | ORAL | Status: DC | PRN

## 2015-07-02 MED ORDER — DEXAMETHASONE SODIUM PHOSPHATE 10 MG/ML IJ SOLN
INTRAMUSCULAR | Status: DC | PRN
  Administered 2015-07-02: 10 mg via INTRAVENOUS

## 2015-07-02 MED ORDER — SUGAMMADEX SODIUM 200 MG/2ML IV SOLN
INTRAVENOUS | Status: DC | PRN
  Administered 2015-07-02: 175 mg via INTRAVENOUS

## 2015-07-02 MED ORDER — ONDANSETRON HCL 4 MG/2ML IJ SOLN
INTRAMUSCULAR | Status: AC
  Filled 2015-07-02: qty 2

## 2015-07-02 SURGICAL SUPPLY — 37 items
APPLIER CLIP 5 13 M/L LIGAMAX5 (MISCELLANEOUS)
APPLIER CLIP ROT 10 11.4 M/L (STAPLE)
APR CLP MED LRG 5 ANG JAW (MISCELLANEOUS)
CABLE HIGH FREQUENCY MONO STRZ (ELECTRODE) ×2 IMPLANT
CLIP APPLIE 5 13 M/L LIGAMAX5 (MISCELLANEOUS) IMPLANT
CLIP APPLIE ROT 10 11.4 M/L (STAPLE) IMPLANT
COVER MAYO STAND STRL (DRAPES) ×2 IMPLANT
COVER SURGICAL LIGHT HANDLE (MISCELLANEOUS) ×2 IMPLANT
DECANTER SPIKE VIAL GLASS SM (MISCELLANEOUS) ×2 IMPLANT
DEVICE TROCAR PUNCTURE CLOSURE (ENDOMECHANICALS) ×2 IMPLANT
DRAPE C-ARM 42X120 X-RAY (DRAPES) ×2 IMPLANT
DRAPE LAPAROSCOPIC ABDOMINAL (DRAPES) ×2 IMPLANT
DRAPE UTILITY XL STRL (DRAPES) ×2 IMPLANT
DRAPE WARM FLUID 44X44 (DRAPE) ×2 IMPLANT
DRSG TEGADERM 2-3/8X2-3/4 SM (GAUZE/BANDAGES/DRESSINGS) ×6 IMPLANT
DRSG TEGADERM 4X4.75 (GAUZE/BANDAGES/DRESSINGS) ×2 IMPLANT
ELECT REM PT RETURN 9FT ADLT (ELECTROSURGICAL) ×2
ELECTRODE REM PT RTRN 9FT ADLT (ELECTROSURGICAL) ×1 IMPLANT
ENDOLOOP SUT PDS II  0 18 (SUTURE)
ENDOLOOP SUT PDS II 0 18 (SUTURE) IMPLANT
GLOVE ECLIPSE 8.0 STRL XLNG CF (GLOVE) ×2 IMPLANT
GLOVE INDICATOR 8.0 STRL GRN (GLOVE) ×2 IMPLANT
GOWN STRL REUS W/TWL XL LVL3 (GOWN DISPOSABLE) ×4 IMPLANT
KIT BASIN OR (CUSTOM PROCEDURE TRAY) ×2 IMPLANT
POSITIONER SURGICAL ARM (MISCELLANEOUS) IMPLANT
POUCH SPECIMEN RETRIEVAL 10MM (ENDOMECHANICALS) IMPLANT
SCISSORS LAP 5X35 DISP (ENDOMECHANICALS) ×2 IMPLANT
SET CHOLANGIOGRAPH MIX (MISCELLANEOUS) ×2 IMPLANT
SET IRRIG TUBING LAPAROSCOPIC (IRRIGATION / IRRIGATOR) ×2 IMPLANT
SLEEVE XCEL OPT CAN 5 100 (ENDOMECHANICALS) IMPLANT
SUT MNCRL AB 4-0 PS2 18 (SUTURE) ×2 IMPLANT
SYR 20CC LL (SYRINGE) ×2 IMPLANT
TOWEL OR 17X26 10 PK STRL BLUE (TOWEL DISPOSABLE) ×2 IMPLANT
TOWEL OR NON WOVEN STRL DISP B (DISPOSABLE) ×2 IMPLANT
TRAY LAPAROSCOPIC (CUSTOM PROCEDURE TRAY) ×2 IMPLANT
TROCAR BLADELESS OPT 5 100 (ENDOMECHANICALS) ×2 IMPLANT
TROCAR XCEL NON-BLD 11X100MML (ENDOMECHANICALS) ×2 IMPLANT

## 2015-07-02 NOTE — Progress Notes (Signed)
eLink Physician-Brief Progress Note Patient Name: Lucas Harper DOB: [birth year] MRN: [id number]   Date of Service  07/02/2015  HPI/Events of Note  Hypotension - BP = 95/41 w/MAP = 59.  eICU Interventions  Will bolus with 0.9 NaCl 1 liter IV over 1 hour now.      Intervention Category Major Interventions: Hypotension - evaluation and management  Sommer,Steven Jarmon 07/02/2015, 10:14 PM

## 2015-07-02 NOTE — Anesthesia Postprocedure Evaluation (Signed)
Anesthesia Post Note  Patient: Lucas Harper  Procedure(s) Performed: Procedure(s) (LRB): LAPAROSCOPIC CHOLECYSTECTOMY WITH INTRAOPERATIVE CHOLANGIOGRAM (N/A)  Patient location during evaluation: PACU Anesthesia Type: General Level of consciousness: awake and alert Pain management: pain level controlled Vital Signs Assessment: post-procedure vital signs reviewed and stable Respiratory status: spontaneous breathing, nonlabored ventilation, respiratory function stable and patient connected to nasal cannula oxygen Cardiovascular status: blood pressure returned to baseline and stable Postop Assessment: no signs of nausea or vomiting Anesthetic complications: no    Last Vitals:  Filed Vitals:   07/02/15 1514 07/02/15 1600  BP: 108/52   Pulse: 91   Temp:  36.6 C  Resp: 26     Last Pain:  Filed Vitals:   07/02/15 1632  PainSc: Asleep                 Reino KentJudd, Katianne Barre J

## 2015-07-02 NOTE — Progress Notes (Signed)
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 Echogenicity within normal limits. No mass or hydronephrosis visualized. Left Kidney: Length:  10.3 cm. 9 mm calculus seen in lower pole. Echogenicity within normal limits. No mass or hydronephrosis visualized.  Abdominal aorta: No aneurysm visualized. Other findings: None. IMPRESSION: Cholelithiasis and sludge are noted within the gallbladder lumen, but no definite gallbladder wall thickening or pericholecystic fluid is noted. Pancreas not visualized due to overlying bowel gas. Bilateral nonobstructive nephrolithiasis is noted. Electronically Signed   By: Marijo Conception, M.D.   On: 06/30/2015 19:47   Ct Abdomen Pelvis W Contrast  06/30/2015  CLINICAL DATA:  Upper abdominal pain, hypotension EXAM: CT ABDOMEN AND PELVIS WITH CONTRAST TECHNIQUE: Multidetector CT imaging of the abdomen and pelvis was performed using the standard protocol following bolus administration of intravenous contrast. CONTRAST:  149m ISOVUE-300 IOPAMIDOL (ISOVUE-300) INJECTION 61% COMPARISON:  11/24/2010 FINDINGS: Lower chest:  Lung bases are unremarkable. Hepatobiliary: Unenhanced liver shows no biliary ductal dilatation. There is distended gallbladder. There is thickening of gallbladder wall up to 6 mm. Gallstones are noted within gallbladder the largest measures 1.1 cm. Cholecystitis cannot be excluded. Clinical correlation is necessary. Further correlation with gallbladder ultrasound is recommended. Pancreas: Enhanced pancreas is unremarkable. Spleen: Enhanced spleen is unremarkable. Adrenals/Urinary Tract: No adrenal gland mass. Kidneys are symmetrical in size and enhancement. No hydronephrosis or hydroureter. At least 3 nonobstructive calcified calculi are noted within right kidney the largest in midpole measures 3.5 mm. Calcified nonobstructive calculus in lower pole of the left kidney measures 7.5 mm. Delayed renal images shows bilateral renal symmetrical excretion. Bilateral visualized proximal ureter is unremarkable. Moderate distended urinary bladder. No urinary bladder filling defects. Stomach/Bowel: No small bowel obstruction. No gastric outlet obstruction. There is some intraluminal small bowel gas without significant small bowel  distension. No pericecal inflammation. The terminal ileum is unremarkable. Normal appendix is noted in axial image 75. Moderate gas noted within transverse colon. Some colonic stool and gas noted within descending colon. Moderate stool noted within sigmoid colon. No colitis or diverticulitis. Vascular/Lymphatic: No aortic aneurysm. No retroperitoneal or mesenteric adenopathy. Reproductive: Prostate gland and seminal vesicles are unremarkable. No pelvic mass is noted. Other: There is no evidence of ascites or free abdominal air. Musculoskeletal: Sagittal images of the spine shows osteopenia and mild degenerative changes lower thoracic spine. No destructive bony lesions are noted. IMPRESSION: 1. There is a distended gallbladder. Thickening and mild enhancement gallbladder wall. Gallstones are noted within gallbladder. Acute cholecystitis cannot be excluded. Clinical correlation is necessary. Further correlation with gallbladder ultrasound is recommended. 2. There is bilateral nonobstructive nephrolithiasis. No hydronephrosis or hydroureter. 3. Diffuse minimal gaseous distension small bowel loops without small bowel obstruction. No thickening of small bowel wall. 4. Colonic stool and gas as described above. No evidence of colitis or diverticulitis. No pericecal inflammation. Normal appendix. Electronically Signed   By: LLahoma CrockerM.D.   On: 06/30/2015 16:16   Dg Chest Port 1 View  06/30/2015  CLINICAL DATA:  Abdominal pain and weakness with vomiting EXAM: PORTABLE CHEST 1 VIEW COMPARISON:  03/29/2013 FINDINGS: Heart size mildly enlarged and stable. Vascular pattern normal. Lungs clear. No effusions. IMPRESSION: No active disease. Electronically Signed   By: RSkipper ClicheM.D.   On: 06/30/2015 13:01    Medications / Allergies: per chart  Antibiotics: Anti-infectives    Start     Dose/Rate Route Frequency Ordered Stop   07/01/15 0800  vancomycin (VANCOCIN) IVPB 1000 mg/200 mL premix  Status:  Discontinued      1,000 mg 200 mL/hr over 60 Minutes Intravenous Every 12 hours 06/30/15 1821 07/01/15 0759  06/30/15 2000  [MAR Hold]  piperacillin-tazobactam (ZOSYN) IVPB 3.375 g     (MAR Hold since 07/02/15 0918)   3.375 g 12.5 mL/hr over 240 Minutes Intravenous Every 8 hours 06/30/15 1249     06/30/15 1830  vancomycin (VANCOCIN) IVPB 1000 mg/200 mL premix     1,000 mg 200 mL/hr over 60 Minutes Intravenous STAT 06/30/15 1821 06/30/15 2121   06/30/15 1245  piperacillin-tazobactam (ZOSYN) IVPB 3.375 g     3.375 g 100 mL/hr over 30 Minutes Intravenous  Once 06/30/15 1239 06/30/15 1414        Note: Portions of this report may have been transcribed using voice recognition software. Every effort was made to ensure accuracy; however, inadvertent computerized transcription errors may be present.   Any transcriptional errors that result from this process are unintentional.     [name], M.D., F.A.C.S. Gastrointestinal and Minimally Invasive Surgery [facility] [address] [place], [place] [address] [phone] Main / Paging   07/02/2015

## 2015-07-02 NOTE — Progress Notes (Signed)
eLink Physician-Brief Progress Note Patient Name: Lucas Harper DOB: [birth year] MRN: [id number]   Date of Service  07/02/2015  HPI/Events of Note  Request for Foley catheter.   eICU Interventions  Will order Foley catheter placed.      Intervention Category Minor Interventions: Routine modifications to care plan (e.g. PRN medications for pain, fever)  Lucas Harper,Steven Astor 07/02/2015, 8:17 PM

## 2015-07-02 NOTE — Anesthesia Procedure Notes (Signed)
Procedure Name: Intubation Date/Time: 07/02/2015 12:55 PM Performed by: Enriqueta ShutterWILLIFORD, Peregrine Nolt D Pre-anesthesia Checklist: Patient identified, Emergency Drugs available, Suction available and Patient being monitored Patient Re-evaluated:Patient Re-evaluated prior to inductionOxygen Delivery Method: Circle System Utilized Preoxygenation: Pre-oxygenation with 100% oxygen Intubation Type: IV induction Ventilation: Mask ventilation without difficulty Laryngoscope Size: Mac and 4 Grade View: Grade II Tube type: Oral Number of attempts: 1 Airway Equipment and Method: Stylet and Oral airway Placement Confirmation: ETT inserted through vocal cords under direct vision,  positive ETCO2 and breath sounds checked- equal and bilateral Secured at: 22 cm Tube secured with: Tape Dental Injury: Teeth and Oropharynx as per pre-operative assessment

## 2015-07-02 NOTE — Discharge Instructions (Signed)
Your appointment is at 10:45am, please arrive at least 30min before your appointment to complete your check in paperwork.  If you are unable to arrive 30min prior to your appointment time we may have to cancel or reschedule you. ° °LAPAROSCOPIC SURGERY: POST OP INSTRUCTIONS  °1. DIET: Follow a light bland diet the first 24 hours after arrival home, such as soup, liquids, crackers, etc. Be sure to include lots of fluids daily. Avoid fast food or heavy meals as your are more likely to get nauseated. Eat a low fat the next few days after surgery.  °2. Take your usually prescribed home medications unless otherwise directed. °3. PAIN CONTROL:  °1. Pain is best controlled by a usual combination of three different methods TOGETHER:  °1. Ice/Heat °2. Over the counter pain medication °3. Prescription pain medication °2. Most patients will experience some swelling and bruising around the incisions. Ice packs or heating pads (30-60 minutes up to 6 times a day) will help. Use ice for the first few days to help decrease swelling and bruising, then switch to heat to help relax tight/sore spots and speed recovery. Some people prefer to use ice alone, heat alone, alternating between ice & heat. Experiment to what works for you. Swelling and bruising can take several weeks to resolve.  °3. It is helpful to take an over-the-counter pain medication regularly for the first few weeks. Choose one of the following that works best for you:  °1. Naproxen (Aleve, etc) Two 220mg tabs twice a day °2. Ibuprofen (Advil, etc) Three 200mg tabs four times a day (every meal & bedtime) °3. Acetaminophen (Tylenol, etc) 500-650mg four times a day (every meal & bedtime) °4. A prescription for pain medication (such as oxycodone, hydrocodone, etc) should be given to you upon discharge. Take your pain medication as prescribed.  °1. If you are having problems/concerns with the prescription medicine (does not control pain, nausea, vomiting, rash, itching,  etc), please call us [phone] to see if we need to switch you to a different pain medicine that will work better for you and/or control your side effect better. °2. If you need a refill on your pain medication, please contact your pharmacy. They will contact our office to request authorization. Prescriptions will not be filled after 5 pm or on week-ends. °4. Avoid getting constipated. Between the surgery and the pain medications, it is common to experience some constipation. Increasing fluid intake and taking a fiber supplement (such as Metamucil, Citrucel, FiberCon, MiraLax, etc) 1-2 times a day regularly will usually help prevent this problem from occurring. A mild laxative (prune juice, Milk of Magnesia, MiraLax, etc) should be taken according to package directions if there are no bowel movements after 48 hours.  °5. Watch out for diarrhea. If you have many loose bowel movements, simplify your diet to bland foods & liquids for a few days. Stop any stool softeners and decrease your fiber supplement. Switching to mild anti-diarrheal medications (Kayopectate, Pepto Bismol) can help. If this worsens or does not improve, please call us. °6. Wash / shower every day. You may shower over the dressings as they are waterproof. Continue to shower over incision(s) after the dressing is off. °7. Remove your waterproof bandages 5 days after surgery. You may leave the incision open to air. You may replace a dressing/Band-Aid to cover the incision for comfort if you wish.  °8. ACTIVITIES as tolerated:  °1. You may resume regular (light) daily activities beginning the next day--such as daily self-care,   walking, climbing stairs--gradually increasing activities as tolerated. If you can walk 30 minutes without difficulty, it is safe to try more intense activity such as jogging, treadmill, bicycling, low-impact aerobics, swimming, etc. °2. Save the most intensive and strenuous activity for last such as sit-ups, heavy lifting,  contact sports, etc Refrain from any heavy lifting or straining until you are off narcotics for pain control.  °3. DO NOT PUSH THROUGH PAIN. Let pain be your guide: If it hurts to do something, don't do it. Pain is your body warning you to avoid that activity for another week until the pain goes down. °4. You may drive when you are no longer taking prescription pain medication, you can comfortably wear a seatbelt, and you can safely maneuver your car and apply brakes. °5. You may have sexual intercourse when it is comfortable.  °9. FOLLOW UP in our office  °1. Please call [facility] at [phone] to set up an appointment to see your surgeon in the office for a follow-up appointment approximately 2-3 weeks after your surgery. °2. Make sure that you call for this appointment the day you arrive home to insure a convenient appointment time. °     10. IF YOU HAVE DISABILITY OR FAMILY LEAVE FORMS, BRING THEM TO THE               OFFICE FOR PROCESSING.  ° °WHEN TO CALL US [phone]:  °1. Poor pain control °2. Reactions / problems with new medications (rash/itching, nausea, etc)  °3. Fever over 101.5 F (38.5 C) °4. Inability to urinate °5. Nausea and/or vomiting °6. Worsening swelling or bruising °7. Continued bleeding from incision. °8. Increased pain, redness, or drainage from the incision ° °The clinic staff is available to answer your questions during regular business hours (8:30am-5pm). Please don’t hesitate to call and ask to speak to one of our nurses for clinical concerns.  °If you have a medical emergency, go to the nearest emergency room or call 911.  °A surgeon from [facility] is always on call at the hospitals  ° °[facility]  °[address], [address], [place], [place] [address] ?  °MAIN: [phone] ? TOLL FREE: [phone] ?  °FAX [phone]  °[email or web address] ° °

## 2015-07-02 NOTE — Op Note (Signed)
07/02/2015  2:19 PM  PATIENT:  Lucas Harper  60 y.o. male  Patient Care Team: Bethena Roys, MD as PCP - General (Gastroenterology) Cammy Copa, MD as Consulting Physician (Orthopedic Surgery)  PRE-OPERATIVE DIAGNOSIS:  cholecystitis  POST-OPERATIVE DIAGNOSIS:  Acute on chronic cholecystitis   PROCEDURE:  LAPAROSCOPIC CHOLECYSTECTOMY WITH INTRAOPERATIVE CHOLANGIOGRAM  SURGEON:  Surgeon(s): Karie Soda, MD  ASSISTANT: Jaynie Bream, MD   ANESTHESIA:   local and general  EBL:  Total I/O In: 1475 [I.V.:1275; IV Piggyback:200] Out: 700 [Urine:650; Blood:50]  Delay start of Pharmacological VTE agent (>24hrs) due to surgical blood loss or risk of bleeding:  no  DRAINS: none   SPECIMEN:  Source of Specimen:  Gallbladder  DISPOSITION OF SPECIMEN:  PATHOLOGY  COUNTS:  YES  PLAN OF CARE: Admit to inpatient   PATIENT DISPOSITION:  PACU - hemodynamically stable.  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  Came in and some shock.  Stabilized with antibiotic and pressors.  Fully anticoagulated.  Cirrhotic toe held.  Cleared by critical care.  Felt to benefit from cholecystectomy 36 hours after admission after shock resolved.  Discussed with the patient and family.  The anatomy & physiology of hepatobiliary & pancreatic function was discussed.  The pathophysiology of gallbladder dysfunction was discussed.  Natural history risks without surgery was discussed.   I feel the risks of no intervention will lead to serious problems that outweigh the operative risks; therefore, I recommended cholecystectomy to remove the pathology.  I explained laparoscopic techniques with possible need for an open approach.  Probable cholangiogram to evaluate the bilary tract was explained as well.    Risks such as bleeding, infection, abscess, leak, injury to other organs, need for further treatment, heart attack, death, and other risks were discussed.  I noted a  good likelihood this will help address the problem.  Possibility that this will not correct all abdominal symptoms was explained.  Goals of post-operative recovery were discussed as well.  We will work to minimize complications.  An educational handout further explaining the pathology and treatment options was given as well.  Questions were answered.  The patient expresses understanding & wishes to proceed with surgery.    OR FINDINGS:  Thickened boggy gallbladder with moderate adhesions consistent with acute on chronic cholecystitis.  Cholangiogram with classic biliary anatomy.  Long tortuous cystic duct.  Some question of intraluminal distal common bile duct.ssall trapezoidal hypolucencies but stable.  Not moving most likely related to the spine and not truly endoluminal hypolucencies.  Certainly no obstruction or delay in flow.  No leak.  No obvious ulcer or tumor.   DESCRIPTION:   The patient was identified & brought into the operating room. The patient was positioned supine with arms tucked. SCDs were active during the entire case. The patient underwent general anesthesia without any difficulty.  The abdomen was prepped and draped in a sterile fashion. A Surgical Timeout confirmed our plan.  We positioned the patient in reverse [place] & right side up.  I placed a 5mm laparoscopic port through the abdominal wall using optical entry technique in the right upper quadrant.  Entry was clean.  We induced carbon dioxide insufflation. Camera inspection revealed no injury. There were no adhesions to the anterior abdominal wall supraumbilically.  I proceeded to continue with laparoscopic technique. I placed a #5 port in supraumbilical region, another 5mm port in the right flank near the anterior axillary line, and a 10mm port in the left subxiphoid region obliquely  within the falciform ligament.  I turned attention to the right upper quadrant.  There are moderate omental adhesions.  We used focused  hydrodissection & sharp scissors to progressively free off to expose the dome of the gallbladder and the ventral wall of the gallbladder.   Gallbladder was quite distended and somewhat difficult to grasp.  End up aspirating and decompressing moderate volume of bile.  No empyema. We used focused scissors and hook cautery to free the peritoneal coverings between the gallbladder and the liver on the posteriolateral and anteriomedial walls.   I used careful blunt and hook dissection to help get a good critical view of the cystic artery and cystic duct. I did further dissection to free a few centimeters of the  gallbladder off the liver bed to get a good critical view of the infundibulum and cystic duct. I mobilized the cystic artery; and, after getting a good 360 view, ligated the cystic artery using clips. I skeletonized the cystic duct.  I placed a clip on the infundibulum. I did a partial cystic duct-otomy and ensured patency. I placed a 5 [place] cholangiocatheter through a puncture site at the right subcostal ridge of the abdominal wall and directed it into the cystic duct.  We ran a cholangiogram with dilute radio-opaque contrast and continuous fluoroscopy.  Contrast flowed from a  Helical side branch consistent with cystic duct cannulization. Contrast flowed up the common hepatic duct into the right and left intrahepatic chains out to secondary radicals. Contrast flowed down the common bile duct easily across the normal ampulla into the duodenum.   Some question of some small trapezoidal hypolucencies and the distal common bile duct/ampulla.  However see more fixed and more consistent with the spine and not truly endoluminal stones.  Otherwise, it was consistent with a normal cholangiogram.  I removed the cholangiocatheter. I placed clips on the cystic duct x4.  We completed cystic duct transection. I freed the gallbladder from its remaining attachments to the liver. I ensured hemostasis on the gallbladder  fossa of the liver and elsewhere. I inspected the rest of the abdomen & detected no injury nor bleeding elsewhere.  I removed the gallbladder.  I closed the subxiphoid fascia transversely using 0 vicryl interrupted stitches. I closed the skin using 4-0 monocryl stitch.  Sterile dressings were applied. The patient was extubated & arrived in the PACU in stable condition..  I had discussed postoperative care with the patient in the holding area.   I am about to locate the patient's family and discuss operative findings and postoperative goals / instructions.  Instructions are written in the chart as well.  Ardeth SportsmanSteven C. Elbridge Magowan, M.D., F.A.C.S. Gastrointestinal and Minimally Invasive Surgery [facility] [address] [place], [place] [id number] [phone] Main / Paging

## 2015-07-02 NOTE — Progress Notes (Signed)
[facility] FOR AM LAB REPLACEMENT ONLY  The patient does [facility] the [facility] Protocol based on the criteria listed below:   1. Is GFR >/= 40 ml/min? Yes.    Patient's GFR today is >60 2. Is urine output >/= 0.5 ml/kg/hr for the last 6 hours? Yes.   Patient's UOP is 0.78 ml/kg/hr 3. Is BUN < 60 mg/dL? Yes.    Patient's BUN today is 6 4. Abnormal electrolyte  K 3.4, Mg 1.7, Phos 2.4 5. Ordered repletion with:per protocol 6. If a panic level lab has been reported, has the C[facility] MD in charge been notified? Yes.  .   Physician:  Gillis Santalva  Mccayla Shimada McEachran 07/02/2015 5:35 AM
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
## 2015-07-02 NOTE — Transfer of Care (Signed)
Immediate Anesthesia Transfer of Care Note  Patient: Lucas Harper  Procedure(s) Performed: Procedure(s): LAPAROSCOPIC CHOLECYSTECTOMY WITH INTRAOPERATIVE CHOLANGIOGRAM (N/A)  Patient Location: PACU  Anesthesia Type:General  Level of Consciousness: awake, alert  and oriented  Airway & Oxygen Therapy: Patient Spontanous Breathing and Patient connected to face mask oxygen  Post-op Assessment: Report given to RN and Post -op Vital signs reviewed and stable  Post vital signs: Reviewed and stable  Last Vitals:  Filed Vitals:   07/02/15 1231 07/02/15 1232  BP:    Pulse: 90 87  Temp:    Resp: 11 16    Last Pain:  Filed Vitals:   07/02/15 1234  PainSc: Asleep      Patients Stated Pain Goal: 3 (07/02/15 1007)  Complications: No apparent anesthesia complications
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
## 2015-07-03 LAB — CULTURE, BLOOD (ROUTINE X 2)

## 2015-07-03 LAB — CBC
HEMATOCRIT: 34.7 % — AB (ref 39.0–52.0)
HEMOGLOBIN: 12.4 g/dL — AB (ref 13.0–17.0)
MCH: 30.8 pg (ref 26.0–34.0)
MCHC: 35.7 g/dL (ref 30.0–36.0)
MCV: 86.3 fL (ref 78.0–100.0)
Platelets: 137 10*3/uL — ABNORMAL LOW (ref 150–400)
RBC: 4.02 MIL/uL — ABNORMAL LOW (ref 4.22–5.81)
RDW: 13.8 % (ref 11.5–15.5)
WBC: 6.5 10*3/uL (ref 4.0–10.5)

## 2015-07-03 LAB — COMPREHENSIVE METABOLIC PANEL
ALBUMIN: 2.5 g/dL — AB (ref 3.5–5.0)
ALK PHOS: 64 U/L (ref 38–126)
ALT: 31 U/L (ref 17–63)
ANION GAP: 5 (ref 5–15)
AST: 45 U/L — ABNORMAL HIGH (ref 15–41)
BILIRUBIN TOTAL: 1.1 mg/dL (ref 0.3–1.2)
CALCIUM: 7.4 mg/dL — AB (ref 8.9–10.3)
CO2: 21 mmol/L — ABNORMAL LOW (ref 22–32)
CREATININE: 0.53 mg/dL — AB (ref 0.61–1.24)
Chloride: 111 mmol/L (ref 101–111)
GLUCOSE: 189 mg/dL — AB (ref 65–99)
POTASSIUM: 4.4 mmol/L (ref 3.5–5.1)
Sodium: 137 mmol/L (ref 135–145)
Total Protein: 4.6 g/dL — ABNORMAL LOW (ref 6.5–8.1)

## 2015-07-03 LAB — MAGNESIUM: MAGNESIUM: 1.7 mg/dL (ref 1.7–2.4)

## 2015-07-03 MED ORDER — ENOXAPARIN SODIUM 40 MG/0.4ML ~~LOC~~ SOLN
40.0000 mg | SUBCUTANEOUS | Status: DC
  Administered 2015-07-03: 40 mg via SUBCUTANEOUS
  Filled 2015-07-03 (×2): qty 0.4

## 2015-07-03 MED ORDER — BUPRENORPHINE HCL 8 MG SL SUBL
8.0000 mg | SUBLINGUAL_TABLET | Freq: Two times a day (BID) | SUBLINGUAL | Status: DC
  Administered 2015-07-03 – 2015-07-05 (×5): 8 mg via SUBLINGUAL
  Filled 2015-07-03 (×5): qty 1

## 2015-07-03 MED ORDER — CEFAZOLIN SODIUM-DEXTROSE 2-4 GM/100ML-% IV SOLN
2.0000 g | Freq: Three times a day (TID) | INTRAVENOUS | Status: DC
  Administered 2015-07-03 – 2015-07-05 (×7): 2 g via INTRAVENOUS
  Filled 2015-07-03 (×8): qty 100

## 2015-07-03 MED ORDER — PANTOPRAZOLE SODIUM 40 MG PO TBEC
40.0000 mg | DELAYED_RELEASE_TABLET | Freq: Every day | ORAL | Status: DC
  Administered 2015-07-03 – 2015-07-05 (×3): 40 mg via ORAL
  Filled 2015-07-03 (×3): qty 1

## 2015-07-03 MED ORDER — BUPRENORPHINE HCL-NALOXONE HCL 8-2 MG SL FILM: 1.0000 | ORAL_FILM | Freq: Two times a day (BID) | SUBLINGUAL | Status: DC

## 2015-07-03 NOTE — Progress Notes (Signed)
Pharmacy Antibiotic Note  Lucas Harper is a 60 y.o. male admitted on 06/30/2015 with IAI.  He is POD1 lap chole with cholangiogram.  Blood cx + Ecoli (pan-sensitive).  He remains on D4 Zosyn.   -Noted he is quadriplegic. UOP good.  CrCl remains stable.  He also remains afebrile.  No leukocytosis.   Plan: De-escalate antibiotics to Ancef 2gm IV q8h as [facility] with Dr [facility].  Please re-consult as needed.   Height: 6' (182.9 cm) Weight: 185 lb 3 oz (84 kg) IBW/kg (Calculated) : 77.6  Temp (24hrs), Avg:98.3 F (36.8 C), Min:97.9 F (36.6 C), Max:98.9 F (37.2 C)   Recent Labs Lab 06/30/15 1200 06/30/15 1210 06/30/15 1505 07/01/15 0543 07/02/15 0326 07/03/15 0314  WBC 8.2  --   --  14.4* 7.3 6.5  CREATININE 0.74  --   --  0.68 0.62 0.53*  LATICACIDVEN  --  2.80* 1.92  --   --   --     Estimated Creatinine Clearance: 110.5 mL/min (by C-G formula based on Cr of 0.53).    No Known Allergies  Antimicrobials this admission: Zosyn 5/22 >> 5/25 Vancomycin 5/22 >> 5/23 Ancef 5/25>>  Dose adjustments this admission:  Microbiology results: 5/22 BCx: 2/2 Ecoli (BCID = E coli)- pan-sens 5/22 UCx: sent  5/22 MRSA PCR: negative  Thank you for allowing pharmacy to be a part of this patient's care.  Junita PushMichelle Kalijah Westfall, PharmD, BCPS Pager: [phone] 07/03/2015, 8:05 AM
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
## 2015-07-04 DIAGNOSIS — R579 Shock, unspecified: Secondary | ICD-10-CM

## 2015-07-04 LAB — BASIC METABOLIC PANEL
ANION GAP: 4 — AB (ref 5–15)
BUN: 6 mg/dL (ref 6–20)
CALCIUM: 7.8 mg/dL — AB (ref 8.9–10.3)
CHLORIDE: 112 mmol/L — AB (ref 101–111)
CO2: 25 mmol/L (ref 22–32)
Creatinine, Ser: 0.57 mg/dL — ABNORMAL LOW (ref 0.61–1.24)
GFR calc Af Amer: >60 mL/min (ref >60)
GFR calc non Af Amer: >60 mL/min (ref >60)
GLUCOSE: 105 mg/dL — AB (ref 65–99)
Potassium: 3.6 mmol/L (ref 3.5–5.1)
Sodium: 141 mmol/L (ref 135–145)

## 2015-07-04 LAB — HEPATIC FUNCTION PANEL
ALBUMIN: 2.4 g/dL — AB (ref 3.5–5.0)
ALK PHOS: 60 U/L (ref 38–126)
ALT: 25 U/L (ref 17–63)
AST: 32 U/L (ref 15–41)
BILIRUBIN DIRECT: 0.1 mg/dL (ref 0.1–0.5)
BILIRUBIN TOTAL: 0.5 mg/dL (ref 0.3–1.2)
Indirect Bilirubin: 0.4 mg/dL (ref 0.3–0.9)
Total Protein: 5 g/dL — ABNORMAL LOW (ref 6.5–8.1)

## 2015-07-04 LAB — CBC
HEMATOCRIT: 32.3 % — AB (ref 39.0–52.0)
HEMOGLOBIN: 11.3 g/dL — AB (ref 13.0–17.0)
MCH: 30.2 pg (ref 26.0–34.0)
MCHC: 35 g/dL (ref 30.0–36.0)
MCV: 86.4 fL (ref 78.0–100.0)
Platelets: 202 10*3/uL (ref 150–400)
RBC: 3.74 MIL/uL — ABNORMAL LOW (ref 4.22–5.81)
RDW: 14 % (ref 11.5–15.5)
WBC: 8.2 10*3/uL (ref 4.0–10.5)

## 2015-07-04 LAB — LIPASE, BLOOD: Lipase: 21 U/L (ref 11–51)

## 2015-07-04 MED ORDER — SODIUM CHLORIDE 0.9 % IV SOLN: 250.0000 mL | INTRAVENOUS | Status: DC | PRN

## 2015-07-04 MED ORDER — RIVAROXABAN 20 MG PO TABS
20.0000 mg | ORAL_TABLET | Freq: Every day | ORAL | Status: DC
  Administered 2015-07-04: 20 mg via ORAL
  Filled 2015-07-04 (×2): qty 1

## 2015-07-04 MED ORDER — METOPROLOL TARTRATE 12.5 MG HALF TABLET
12.5000 mg | ORAL_TABLET | Freq: Two times a day (BID) | ORAL | Status: DC
  Administered 2015-07-04 – 2015-07-05 (×3): 12.5 mg via ORAL
  Filled 2015-07-04 (×5): qty 1

## 2015-07-04 MED ORDER — SODIUM CHLORIDE 0.9% FLUSH: 3.0000 mL | INTRAVENOUS | Status: DC | PRN

## 2015-07-04 MED ORDER — SODIUM CHLORIDE 0.9% FLUSH
3.0000 mL | Freq: Two times a day (BID) | INTRAVENOUS | Status: DC
  Administered 2015-07-04 (×2): 3 mL via INTRAVENOUS

## 2015-07-04 MED ORDER — RIVAROXABAN 15 MG PO TABS: 15.0000 mg | ORAL_TABLET | Freq: Two times a day (BID) | ORAL | Status: DC

## 2015-07-04 MED ORDER — RIVAROXABAN 15 MG PO TABS
15.0000 mg | ORAL_TABLET | Freq: Every day | ORAL | Status: DC
  Filled 2015-07-04: qty 1

## 2015-07-04 NOTE — Progress Notes (Signed)
2 Days Post-Op  Subjective: Transferred to the Floor yesterday.   Tolerated diet advancement, although patient states that he primarily drank rather than ate. Passing gas.  Denies BM. States that pain is well controlled. Since foley placed, has had good UOP.  Objective: Vital signs in last 24 hours: Temp:  [97.5 F (36.4 C)-98.6 F (37 C)] 97.6 F (36.4 C) (05/26 0511) Pulse Rate:  [68-82] 82 (05/26 0511) Resp:  [14-20] 20 (05/26 0511) BP: (102-127)/(49-73) 127/73 mmHg (05/26 0511) SpO2:  [94 %-96 %] 96 % (05/26 0511) Last BM Date:  (pta)  Intake/Output from previous day: 05/25 0701 - 05/26 0700 In: 956 [P.O.:631; I.V.:225; IV Piggyback:100] Out: 1236 [Urine:1236] Intake/Output this shift: Total I/O In: 60 [P.O.:60] Out: -   General appearance: alert, cooperative, appears stated age and no distress Resp: Nonlabored respirations.  On RA GI: Soft.  Nondistended.  Appropriately tender to palpation adjacent to incisions.  no evidence of infection or hernia.  Lab Results:   Recent Labs  07/03/15 0314 07/04/15 0513  WBC 6.5 8.2  HGB 12.4* 11.3*  HCT 34.7* 32.3*  PLT 137* 202   BMET  Recent Labs  07/03/15 0314 07/04/15 0513  NA 137 141  K 4.4 3.6  CL 111 112*  CO2 21* 25  GLUCOSE 189* 105*  BUN <5* 6  CREATININE 0.53* 0.57*  CALCIUM 7.4* 7.8*   PT/INR No results for input(s): LABPROT, INR in the last 72 hours. ABG No results for input(s): PHART, HCO3 in the last 72 hours.  Invalid input(s): PCO2, PO2  Studies/Results: Dg Cholangiogram Operative  07/02/2015  CLINICAL DATA:  Cholelithiasis EXAM: INTRAOPERATIVE CHOLANGIOGRAM TECHNIQUE: Cholangiographic images from the C-arm fluoroscopic device were submitted for interpretation post-operatively. Please see the procedural report for the amount of contrast and the fluoroscopy time utilized. COMPARISON:  Ultrasound 06/30/2015 FINDINGS: On this single projection, there is a persistent eccentric nonmobile  nonocclusive filling defect projecting in the distal CBD. Intrahepatic ducts are incompletely visualized, appearing decompressed centrally. Contrast passes freely into the duodenum. : 1. Atypical filling defect in the distal CBD without obstruction. If there is clinical concern of choledocholithiasis, ERCP may be useful. Electronically Signed   By: Corlis Leak  Hassell M.D.   On: 07/02/2015 14:09    Anti-infectives: Anti-infectives    Start     Dose/Rate Route Frequency Ordered Stop   07/03/15 1000  ceFAZolin (ANCEF) IVPB 2g/100 mL premix     2 g 200 mL/hr over 30 Minutes Intravenous Every 8 hours 07/03/15 0850     07/01/15 0800  vancomycin (VANCOCIN) IVPB 1000 mg/200 mL premix  Status:  Discontinued     1,000 mg 200 mL/hr over 60 Minutes Intravenous Every 12 hours 06/30/15 1821 07/01/15 0759   06/30/15 2000  piperacillin-tazobactam (ZOSYN) IVPB 3.375 g  Status:  Discontinued     3.375 g 12.5 mL/hr over 240 Minutes Intravenous Every 8 hours 06/30/15 1249 07/03/15 0833   06/30/15 1830  vancomycin (VANCOCIN) IVPB 1000 mg/200 mL premix     1,000 mg 200 mL/hr over 60 Minutes Intravenous STAT 06/30/15 1821 06/30/15 2121   06/30/15 1245  piperacillin-tazobactam (ZOSYN) IVPB 3.375 g     3.375 g 100 mL/hr over 30 Minutes Intravenous  Once 06/30/15 1239 06/30/15 1414      Assessment/Plan: s/p Procedure(s): LAPAROSCOPIC CHOLECYSTECTOMY WITH INTRAOPERATIVE CHOLANGIOGRAM (N/A) POD 2 s/p lap cholecystectomy  -Pain appears to be doing well with controlled on current regimen although review of the records appears that patient is primarily receiving fentanyl  and not hydrocodone.  Will DC fentanyl. Was on suboxone at home. -IOC with filling defect in distal CBD (could be small stones). LFTs yesterday were normal with bili 1.1.  Repeat LFTs pending this morning.  If normal, would not intervene as these will likely pass.  If bili is elevated, would consider GI consult. -WBC normal this AM.  If LFTs normal and no  evidence of CBD obstruction would DC abx as presumed source (gallbladder) has been removed and labs are normal. -Dispo:  If labs are normal, he is tolerating PO, and pain is controlled with oral medications, would be appropriate for DC.  Already has follow up arranged with [facility] on 07/16/2015.   LOS: 4 days    Concha Se 07/04/2015

## 2015-07-04 NOTE — Progress Notes (Signed)
Date:  Jul 04, 2015 Chart reviewed for concurrent status and case management needs. Will continue to follow patient for changes and needs:  Cholecystectomy on [id number]. Expected discharge date: [id number] Marcelle SmilingRhonda Burech Mcfarland, BSN, [id number], [place]   [phone]
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
## 2015-07-04 NOTE — Care Management Important Message (Signed)
Important Message  Patient Details  Name: Lucas Harper MRN: [id number] Date of Birth: 1955-10-14   Medicare Important Message Given:  Yes    Haskell FlirtJamison, Rejina Odle 07/04/2015, 9:14 AMImportant Message  Patient Details  Name: Lucas Harper MRN: [id number] Date of Birth: 1955-10-14   Medicare Important Message Given:  Yes    Haskell FlirtJamison, Yarnell Kozloski 07/04/2015, 9:13 AM

## 2015-07-05 DIAGNOSIS — R7881 Bacteremia: Secondary | ICD-10-CM | POA: Diagnosis present

## 2015-07-05 LAB — BASIC METABOLIC PANEL
ANION GAP: 8 (ref 5–15)
BUN: 6 mg/dL (ref 6–20)
CHLORIDE: 104 mmol/L (ref 101–111)
CO2: 23 mmol/L (ref 22–32)
Calcium: 7.8 mg/dL — ABNORMAL LOW (ref 8.9–10.3)
Creatinine, Ser: 0.54 mg/dL — ABNORMAL LOW (ref 0.61–1.24)
GFR calc non Af Amer: >60 mL/min (ref >60)
Glucose, Bld: 80 mg/dL (ref 65–99)
POTASSIUM: 3.3 mmol/L — AB (ref 3.5–5.1)
Sodium: 135 mmol/L (ref 135–145)

## 2015-07-05 LAB — CBC
HEMATOCRIT: 33 % — AB (ref 39.0–52.0)
HEMOGLOBIN: 11.7 g/dL — AB (ref 13.0–17.0)
MCH: 29.8 pg (ref 26.0–34.0)
MCHC: 35.5 g/dL (ref 30.0–36.0)
MCV: 84 fL (ref 78.0–100.0)
Platelets: 212 10*3/uL (ref 150–400)
RBC: 3.93 MIL/uL — ABNORMAL LOW (ref 4.22–5.81)
RDW: 13.7 % (ref 11.5–15.5)
WBC: 8.3 10*3/uL (ref 4.0–10.5)

## 2015-07-05 MED ORDER — METOPROLOL TARTRATE 25 MG PO TABS
25.0000 mg | ORAL_TABLET | Freq: Two times a day (BID) | ORAL | Status: AC
Start: 2015-07-05 — End: ?

## 2015-07-05 MED ORDER — RIVAROXABAN 20 MG PO TABS: 20.0000 mg | ORAL_TABLET | Freq: Every day | ORAL | Status: AC

## 2015-07-05 MED ORDER — CEFDINIR 300 MG PO CAPS: 300.0000 mg | ORAL_CAPSULE | Freq: Two times a day (BID) | ORAL | Status: AC

## 2015-07-05 MED ORDER — POTASSIUM CHLORIDE ER 20 MEQ PO TBCR: 20.0000 meq | EXTENDED_RELEASE_TABLET | Freq: Every day | ORAL | Status: AC
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
## 2015-07-05 NOTE — Care Management Note (Addendum)
Case Management Note  Patient Details  Name: Lucas Harper MRN: [id number] Date of Birth: [birth year]  Subjective/Objective:      Bacteremia due to Escherichia coli             Action/Plan: Discharge Planning: AVS reviewed:  NCM spoke to pt's dtr, Ava Mcquerry. States pt lives in home with her and family. They have round the clock care for him and aide that comes to assist with bathing. Dtr requestin ambulance transport. Faxed transport sheet to Unit RN with contact number to call for ambulance.   1530 HH RN arranged with [facility]. NCM spoke to dtr via phone, Ava and offered [facility]. [facility] Contacted [facility] Liaison for new referral.   Clarisse GougePCP-AUSTIN, WILLIAM MD  Expected Discharge Date:  07/05/2015            Expected Discharge Plan:  Home/Self Care  In-House Referral:  NA  Discharge planning Services  CM Consult  Post Acute Care Choice:  NA Choice offered to:  NA  DME Arranged:  N/A DME Agency:  NA  HH Arranged:  NA HH Agency:  NA  Status of Service:  Completed, signed off  Medicare Important Message Given:  Yes Date Medicare IM Given:    Medicare IM give by:    Date Additional Medicare IM Given:    Additional Medicare Important Message give by:     If discussed at Long Length of Stay Meetings, dates discussed:    Additional Comments:  Elliot CousinShavis, Aiyla Baucom Ellen, RN 07/05/2015, 10:41 AM

## 2015-07-05 NOTE — Progress Notes (Signed)
Patient discharged home.  Accompanied by ex-wife.  [facility] transporting patient home.  Patient leaving with personal belongings.  Discharge orders understood by patient and his ex-wife.  Patient leaving with prescriptions.  [facility] [phone] will provide Home Health RN for patient.  Patient denies pain.  Room air.  No complaints.

## 2015-08-05 DIAGNOSIS — E7800 Pure hypercholesterolemia, unspecified: Secondary | ICD-10-CM | POA: Diagnosis not present

## 2015-08-05 DIAGNOSIS — G609 Hereditary and idiopathic neuropathy, unspecified: Secondary | ICD-10-CM | POA: Diagnosis not present

## 2015-08-05 DIAGNOSIS — E291 Testicular hypofunction: Secondary | ICD-10-CM | POA: Diagnosis not present

## 2015-08-05 DIAGNOSIS — M25552 Pain in left hip: Secondary | ICD-10-CM | POA: Diagnosis not present

## 2015-08-05 DIAGNOSIS — M159 Polyosteoarthritis, unspecified: Secondary | ICD-10-CM | POA: Diagnosis not present

## 2015-08-05 DIAGNOSIS — I2699 Other pulmonary embolism without acute cor pulmonale: Secondary | ICD-10-CM | POA: Diagnosis not present

## 2015-08-05 DIAGNOSIS — I10 Essential (primary) hypertension: Secondary | ICD-10-CM | POA: Diagnosis not present

## 2015-08-05 DIAGNOSIS — K219 Gastro-esophageal reflux disease without esophagitis: Secondary | ICD-10-CM | POA: Diagnosis not present

## 2015-10-06 DIAGNOSIS — E291 Testicular hypofunction: Secondary | ICD-10-CM | POA: Diagnosis not present

## 2015-10-06 DIAGNOSIS — K219 Gastro-esophageal reflux disease without esophagitis: Secondary | ICD-10-CM | POA: Diagnosis not present

## 2015-10-06 DIAGNOSIS — I10 Essential (primary) hypertension: Secondary | ICD-10-CM | POA: Diagnosis not present

## 2015-10-06 DIAGNOSIS — M25552 Pain in left hip: Secondary | ICD-10-CM | POA: Diagnosis not present

## 2015-10-06 DIAGNOSIS — E7800 Pure hypercholesterolemia, unspecified: Secondary | ICD-10-CM | POA: Diagnosis not present

## 2015-10-06 DIAGNOSIS — M159 Polyosteoarthritis, unspecified: Secondary | ICD-10-CM | POA: Diagnosis not present

## 2015-10-06 DIAGNOSIS — G609 Hereditary and idiopathic neuropathy, unspecified: Secondary | ICD-10-CM | POA: Diagnosis not present

## 2015-10-06 DIAGNOSIS — I2699 Other pulmonary embolism without acute cor pulmonale: Secondary | ICD-10-CM | POA: Diagnosis not present

## 2015-12-09 DIAGNOSIS — M25552 Pain in left hip: Secondary | ICD-10-CM | POA: Diagnosis not present

## 2015-12-09 DIAGNOSIS — M159 Polyosteoarthritis, unspecified: Secondary | ICD-10-CM | POA: Diagnosis not present

## 2015-12-09 DIAGNOSIS — E7800 Pure hypercholesterolemia, unspecified: Secondary | ICD-10-CM | POA: Diagnosis not present

## 2015-12-09 DIAGNOSIS — K219 Gastro-esophageal reflux disease without esophagitis: Secondary | ICD-10-CM | POA: Diagnosis not present

## 2015-12-09 DIAGNOSIS — I10 Essential (primary) hypertension: Secondary | ICD-10-CM | POA: Diagnosis not present

## 2015-12-09 DIAGNOSIS — Z681 Body mass index (BMI) 19 or less, adult: Secondary | ICD-10-CM | POA: Diagnosis not present

## 2015-12-09 DIAGNOSIS — G609 Hereditary and idiopathic neuropathy, unspecified: Secondary | ICD-10-CM | POA: Diagnosis not present

## 2015-12-09 DIAGNOSIS — I2699 Other pulmonary embolism without acute cor pulmonale: Secondary | ICD-10-CM | POA: Diagnosis not present

## 2015-12-09 DIAGNOSIS — E291 Testicular hypofunction: Secondary | ICD-10-CM | POA: Diagnosis not present

## 2016-02-11 DIAGNOSIS — K219 Gastro-esophageal reflux disease without esophagitis: Secondary | ICD-10-CM | POA: Diagnosis not present

## 2016-02-11 DIAGNOSIS — M159 Polyosteoarthritis, unspecified: Secondary | ICD-10-CM | POA: Diagnosis not present

## 2016-02-11 DIAGNOSIS — G609 Hereditary and idiopathic neuropathy, unspecified: Secondary | ICD-10-CM | POA: Diagnosis not present

## 2016-02-11 DIAGNOSIS — E291 Testicular hypofunction: Secondary | ICD-10-CM | POA: Diagnosis not present

## 2016-02-11 DIAGNOSIS — Z681 Body mass index (BMI) 19 or less, adult: Secondary | ICD-10-CM | POA: Diagnosis not present

## 2016-02-11 DIAGNOSIS — M25552 Pain in left hip: Secondary | ICD-10-CM | POA: Diagnosis not present

## 2016-02-11 DIAGNOSIS — I2699 Other pulmonary embolism without acute cor pulmonale: Secondary | ICD-10-CM | POA: Diagnosis not present

## 2016-02-11 DIAGNOSIS — I10 Essential (primary) hypertension: Secondary | ICD-10-CM | POA: Diagnosis not present

## 2016-02-11 DIAGNOSIS — E7800 Pure hypercholesterolemia, unspecified: Secondary | ICD-10-CM | POA: Diagnosis not present

## 2016-02-27 IMAGING — CR DG TIBIA/FIBULA 2V*L*
4 series · 4 of 4 positions shown · non-contrast
Comparison: DG TIBIA/FIBULA*L* dated 06/26/2010

CLINICAL DATA: Laceration to the anterior lateral midshaft left
lower leg.

EXAM:
LEFT TIBIA AND FIBULA - 2 VIEW

[x tib-fib ap left (1 of 2)]
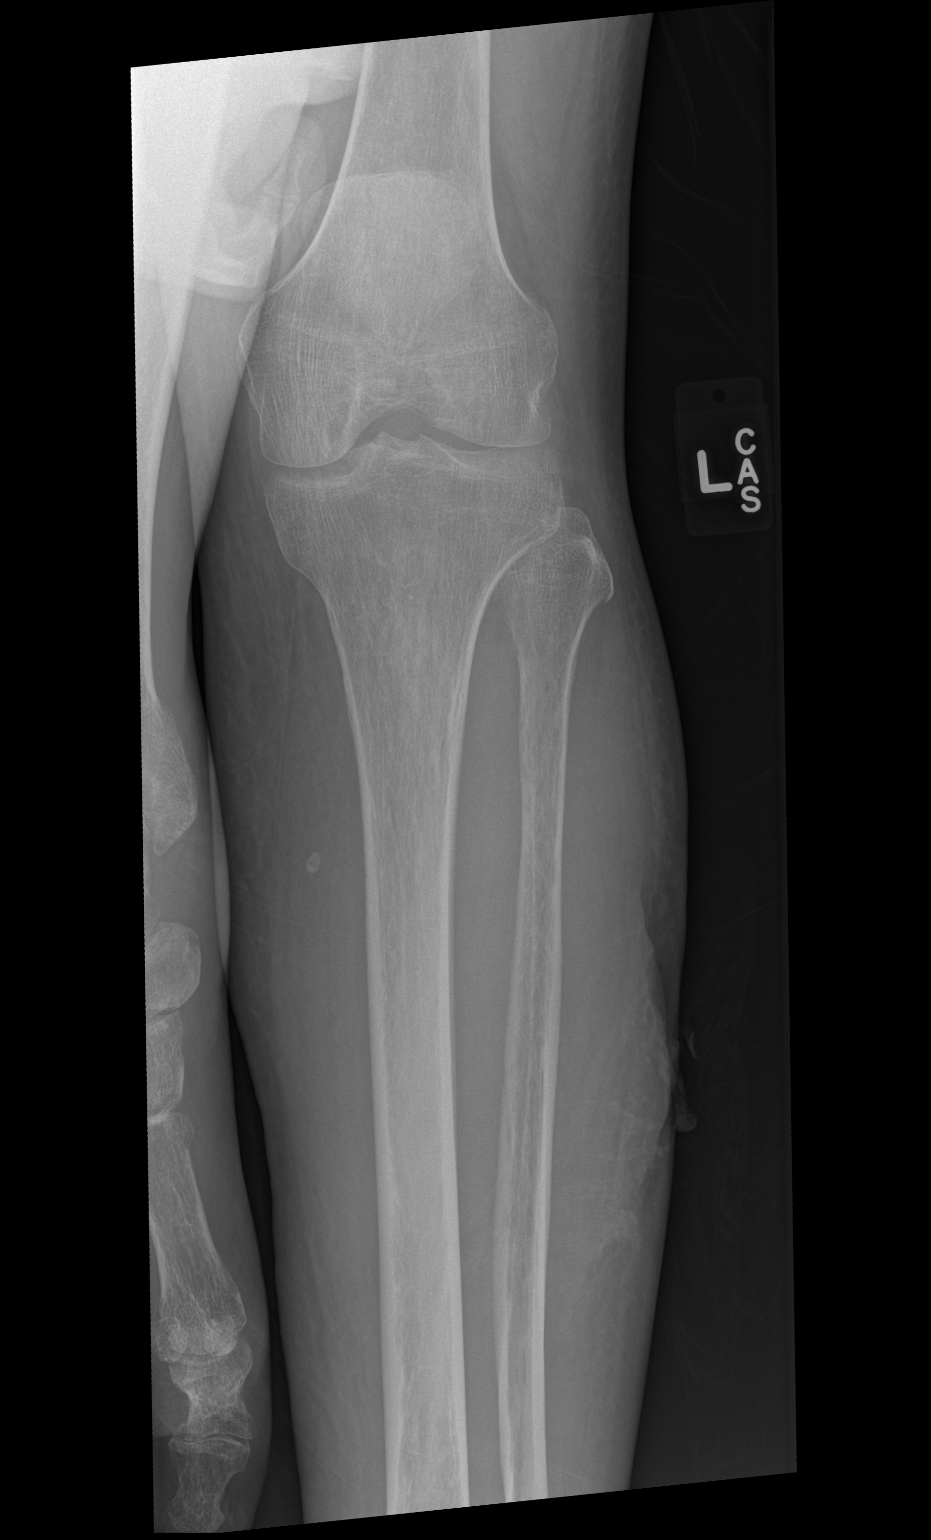

[x tib-fib ap left (2 of 2)]
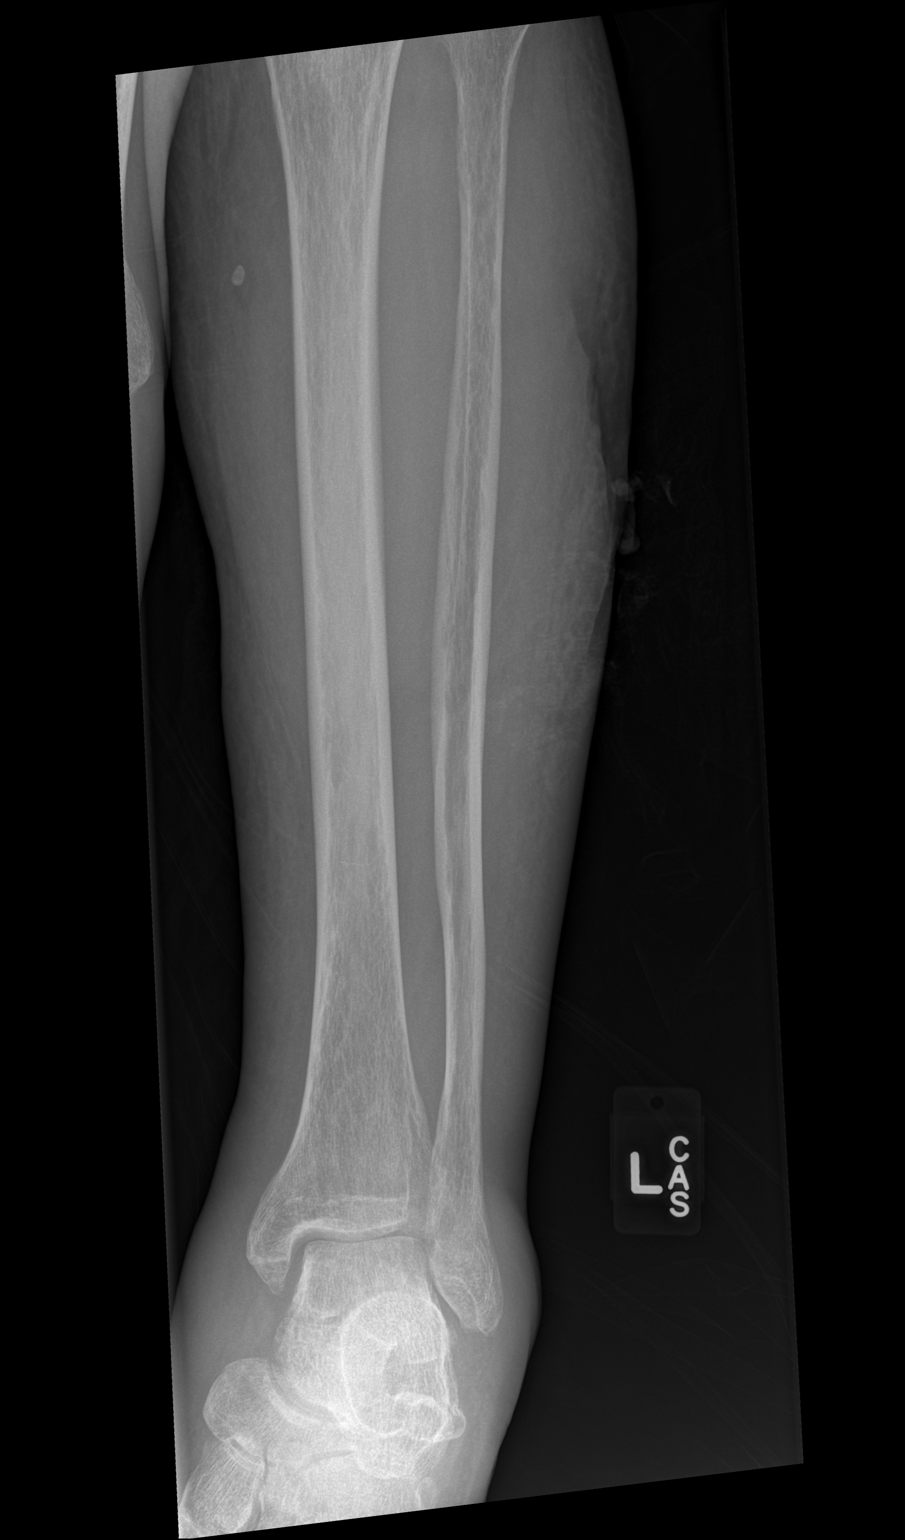

[x tib-fib lat left (1 of 2)]
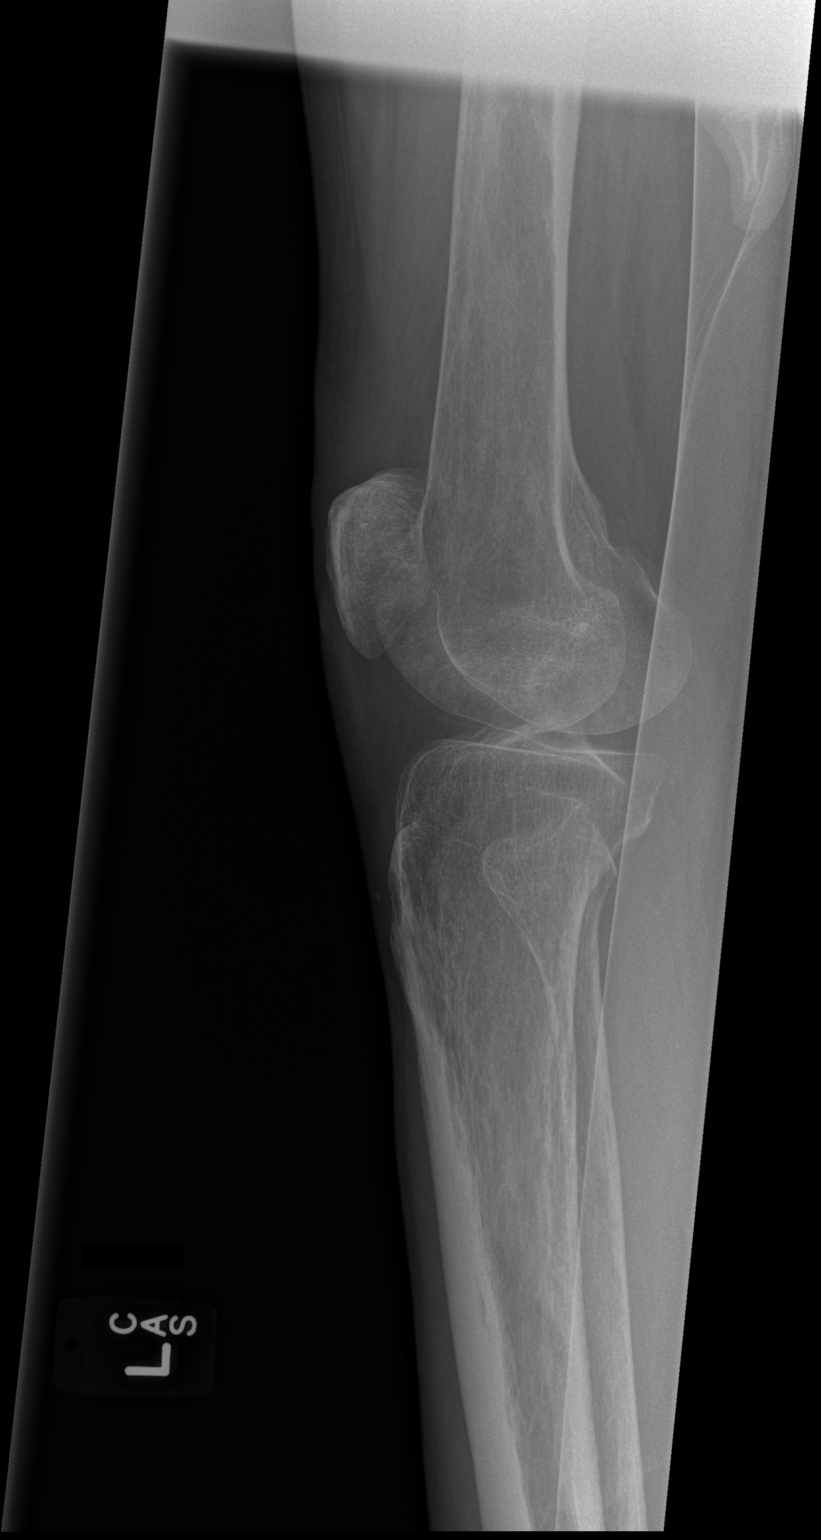

[x tib-fib lat left (2 of 2)]
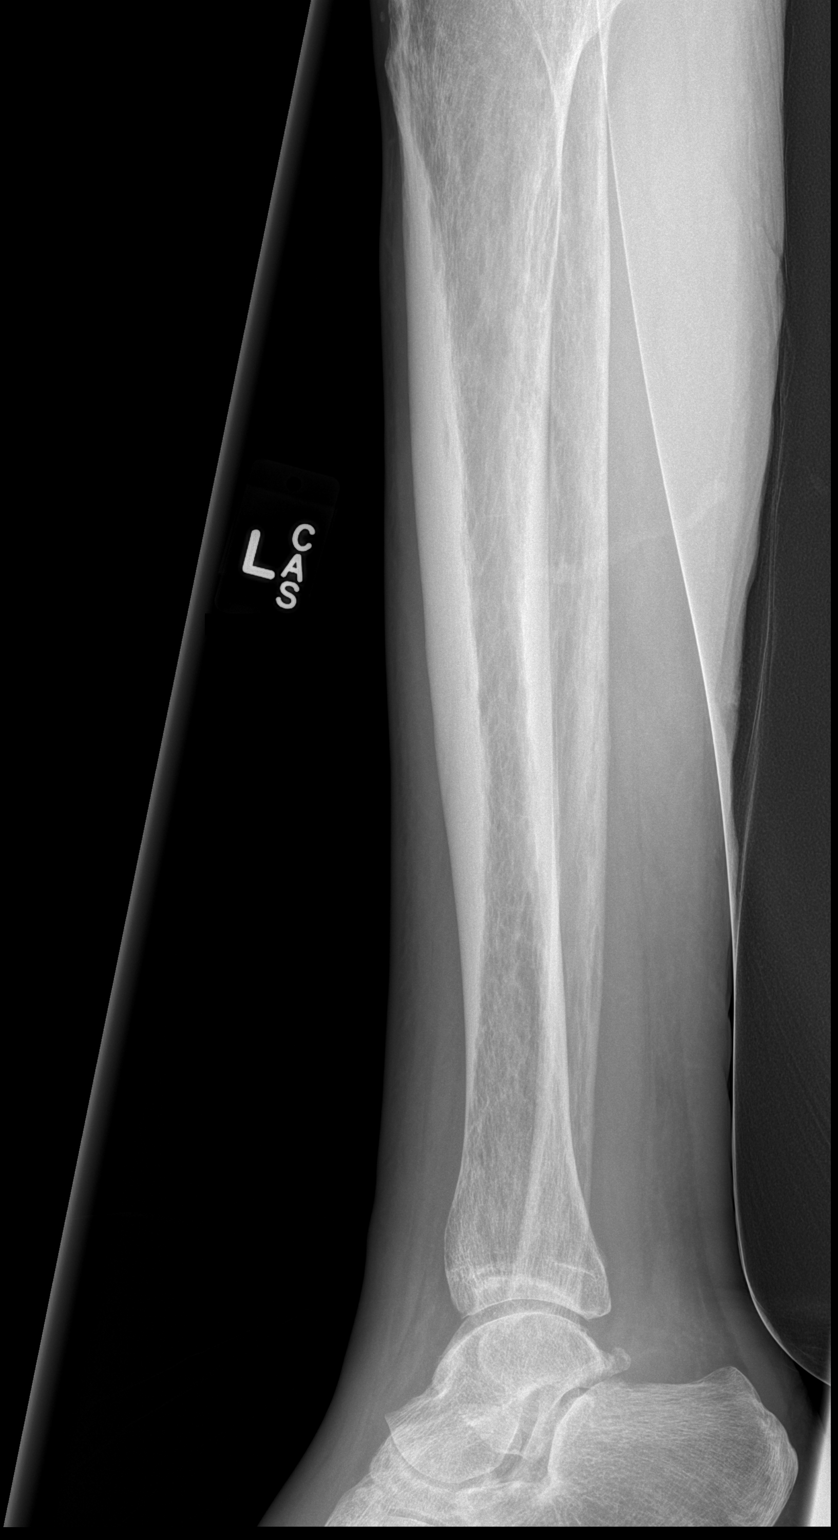

[4 of 4 positions shown; findings below may reference images not displayed]

FINDINGS: Soft tissue defect over the lateral aspect of the mid lower leg. No
radiopaque soft tissue foreign bodies. Underlying bones appear
intact without evidence of acute fracture or subluxation. Diffuse
bone demineralization is present.
IMPRESSION: Soft tissue injury. No radiopaque foreign bodies. No acute bony
abnormalities.

## 2016-04-20 DIAGNOSIS — E7800 Pure hypercholesterolemia, unspecified: Secondary | ICD-10-CM | POA: Diagnosis not present

## 2016-04-20 DIAGNOSIS — G609 Hereditary and idiopathic neuropathy, unspecified: Secondary | ICD-10-CM | POA: Diagnosis not present

## 2016-04-20 DIAGNOSIS — M25552 Pain in left hip: Secondary | ICD-10-CM | POA: Diagnosis not present

## 2016-04-20 DIAGNOSIS — K219 Gastro-esophageal reflux disease without esophagitis: Secondary | ICD-10-CM | POA: Diagnosis not present

## 2016-04-20 DIAGNOSIS — E291 Testicular hypofunction: Secondary | ICD-10-CM | POA: Diagnosis not present

## 2016-04-20 DIAGNOSIS — M159 Polyosteoarthritis, unspecified: Secondary | ICD-10-CM | POA: Diagnosis not present

## 2016-04-20 DIAGNOSIS — I2699 Other pulmonary embolism without acute cor pulmonale: Secondary | ICD-10-CM | POA: Diagnosis not present

## 2016-04-20 DIAGNOSIS — I10 Essential (primary) hypertension: Secondary | ICD-10-CM | POA: Diagnosis not present

## 2016-06-23 DIAGNOSIS — I10 Essential (primary) hypertension: Secondary | ICD-10-CM | POA: Diagnosis not present

## 2016-06-23 DIAGNOSIS — E291 Testicular hypofunction: Secondary | ICD-10-CM | POA: Diagnosis not present

## 2016-06-23 DIAGNOSIS — Z6829 Body mass index (BMI) 29.0-29.9, adult: Secondary | ICD-10-CM | POA: Diagnosis not present

## 2016-06-23 DIAGNOSIS — G609 Hereditary and idiopathic neuropathy, unspecified: Secondary | ICD-10-CM | POA: Diagnosis not present

## 2016-06-23 DIAGNOSIS — I2699 Other pulmonary embolism without acute cor pulmonale: Secondary | ICD-10-CM | POA: Diagnosis not present

## 2016-06-23 DIAGNOSIS — E7800 Pure hypercholesterolemia, unspecified: Secondary | ICD-10-CM | POA: Diagnosis not present

## 2016-06-23 DIAGNOSIS — M159 Polyosteoarthritis, unspecified: Secondary | ICD-10-CM | POA: Diagnosis not present

## 2016-06-23 DIAGNOSIS — K219 Gastro-esophageal reflux disease without esophagitis: Secondary | ICD-10-CM | POA: Diagnosis not present

## 2016-06-23 DIAGNOSIS — M25552 Pain in left hip: Secondary | ICD-10-CM | POA: Diagnosis not present

## 2016-08-24 DIAGNOSIS — M159 Polyosteoarthritis, unspecified: Secondary | ICD-10-CM | POA: Diagnosis not present

## 2016-08-24 DIAGNOSIS — M25552 Pain in left hip: Secondary | ICD-10-CM | POA: Diagnosis not present

## 2016-08-24 DIAGNOSIS — K219 Gastro-esophageal reflux disease without esophagitis: Secondary | ICD-10-CM | POA: Diagnosis not present

## 2016-08-24 DIAGNOSIS — I10 Essential (primary) hypertension: Secondary | ICD-10-CM | POA: Diagnosis not present

## 2016-08-24 DIAGNOSIS — I2699 Other pulmonary embolism without acute cor pulmonale: Secondary | ICD-10-CM | POA: Diagnosis not present

## 2016-08-24 DIAGNOSIS — E291 Testicular hypofunction: Secondary | ICD-10-CM | POA: Diagnosis not present

## 2016-08-24 DIAGNOSIS — E7800 Pure hypercholesterolemia, unspecified: Secondary | ICD-10-CM | POA: Diagnosis not present

## 2016-08-24 DIAGNOSIS — G609 Hereditary and idiopathic neuropathy, unspecified: Secondary | ICD-10-CM | POA: Diagnosis not present

## 2016-10-15 DIAGNOSIS — I10 Essential (primary) hypertension: Secondary | ICD-10-CM | POA: Diagnosis not present

## 2016-10-15 DIAGNOSIS — G43A0 Cyclical vomiting, not intractable: Secondary | ICD-10-CM | POA: Diagnosis not present

## 2016-10-15 DIAGNOSIS — R1013 Epigastric pain: Secondary | ICD-10-CM | POA: Diagnosis not present

## 2016-10-20 DIAGNOSIS — K219 Gastro-esophageal reflux disease without esophagitis: Secondary | ICD-10-CM | POA: Diagnosis not present

## 2016-10-20 DIAGNOSIS — Z79891 Long term (current) use of opiate analgesic: Secondary | ICD-10-CM | POA: Diagnosis not present

## 2016-10-20 DIAGNOSIS — Z7901 Long term (current) use of anticoagulants: Secondary | ICD-10-CM | POA: Diagnosis not present

## 2016-10-20 DIAGNOSIS — K8050 Calculus of bile duct without cholangitis or cholecystitis without obstruction: Secondary | ICD-10-CM | POA: Diagnosis not present

## 2016-10-20 DIAGNOSIS — I4891 Unspecified atrial fibrillation: Secondary | ICD-10-CM | POA: Diagnosis not present

## 2016-10-20 DIAGNOSIS — Z9049 Acquired absence of other specified parts of digestive tract: Secondary | ICD-10-CM | POA: Diagnosis not present

## 2016-10-20 DIAGNOSIS — K317 Polyp of stomach and duodenum: Secondary | ICD-10-CM | POA: Diagnosis not present

## 2016-10-20 DIAGNOSIS — Z79899 Other long term (current) drug therapy: Secondary | ICD-10-CM | POA: Diagnosis not present

## 2016-10-20 DIAGNOSIS — G8191 Hemiplegia, unspecified affecting right dominant side: Secondary | ICD-10-CM | POA: Diagnosis not present

## 2016-10-20 DIAGNOSIS — I10 Essential (primary) hypertension: Secondary | ICD-10-CM | POA: Diagnosis not present

## 2016-10-20 DIAGNOSIS — K449 Diaphragmatic hernia without obstruction or gangrene: Secondary | ICD-10-CM | POA: Diagnosis not present

## 2016-10-20 DIAGNOSIS — G8929 Other chronic pain: Secondary | ICD-10-CM | POA: Diagnosis not present

## 2016-10-25 DIAGNOSIS — Z79899 Other long term (current) drug therapy: Secondary | ICD-10-CM | POA: Diagnosis not present

## 2016-10-25 DIAGNOSIS — G8191 Hemiplegia, unspecified affecting right dominant side: Secondary | ICD-10-CM | POA: Diagnosis not present

## 2016-10-25 DIAGNOSIS — Z9049 Acquired absence of other specified parts of digestive tract: Secondary | ICD-10-CM | POA: Diagnosis not present

## 2016-10-25 DIAGNOSIS — I10 Essential (primary) hypertension: Secondary | ICD-10-CM | POA: Diagnosis not present

## 2016-10-25 DIAGNOSIS — Z7901 Long term (current) use of anticoagulants: Secondary | ICD-10-CM | POA: Diagnosis not present

## 2016-10-25 DIAGNOSIS — K219 Gastro-esophageal reflux disease without esophagitis: Secondary | ICD-10-CM | POA: Diagnosis not present

## 2016-10-25 DIAGNOSIS — K8050 Calculus of bile duct without cholangitis or cholecystitis without obstruction: Secondary | ICD-10-CM | POA: Diagnosis not present

## 2016-10-25 DIAGNOSIS — R932 Abnormal findings on diagnostic imaging of liver and biliary tract: Secondary | ICD-10-CM | POA: Diagnosis not present

## 2016-11-04 DIAGNOSIS — I10 Essential (primary) hypertension: Secondary | ICD-10-CM | POA: Diagnosis not present

## 2016-11-04 DIAGNOSIS — M546 Pain in thoracic spine: Secondary | ICD-10-CM | POA: Diagnosis not present

## 2016-11-04 DIAGNOSIS — G8929 Other chronic pain: Secondary | ICD-10-CM | POA: Diagnosis not present

## 2016-11-04 DIAGNOSIS — G8250 Quadriplegia, unspecified: Secondary | ICD-10-CM | POA: Diagnosis not present

## 2016-11-04 DIAGNOSIS — Z23 Encounter for immunization: Secondary | ICD-10-CM | POA: Diagnosis not present

## 2016-11-06 DIAGNOSIS — Z87828 Personal history of other (healed) physical injury and trauma: Secondary | ICD-10-CM | POA: Diagnosis not present

## 2016-11-06 DIAGNOSIS — M6281 Muscle weakness (generalized): Secondary | ICD-10-CM | POA: Diagnosis not present

## 2016-11-06 DIAGNOSIS — Z87312 Personal history of (healed) stress fracture: Secondary | ICD-10-CM | POA: Diagnosis not present

## 2016-11-06 DIAGNOSIS — Z86711 Personal history of pulmonary embolism: Secondary | ICD-10-CM | POA: Diagnosis not present

## 2016-11-06 DIAGNOSIS — M545 Low back pain: Secondary | ICD-10-CM | POA: Diagnosis not present

## 2016-11-06 DIAGNOSIS — G8250 Quadriplegia, unspecified: Secondary | ICD-10-CM | POA: Diagnosis not present

## 2016-11-06 DIAGNOSIS — Z7901 Long term (current) use of anticoagulants: Secondary | ICD-10-CM | POA: Diagnosis not present

## 2016-11-10 DIAGNOSIS — Z87312 Personal history of (healed) stress fracture: Secondary | ICD-10-CM | POA: Diagnosis not present

## 2016-11-10 DIAGNOSIS — G8250 Quadriplegia, unspecified: Secondary | ICD-10-CM | POA: Diagnosis not present

## 2016-11-10 DIAGNOSIS — Z86711 Personal history of pulmonary embolism: Secondary | ICD-10-CM | POA: Diagnosis not present

## 2016-11-10 DIAGNOSIS — M6281 Muscle weakness (generalized): Secondary | ICD-10-CM | POA: Diagnosis not present

## 2016-11-10 DIAGNOSIS — M545 Low back pain: Secondary | ICD-10-CM | POA: Diagnosis not present

## 2016-11-10 DIAGNOSIS — Z87828 Personal history of other (healed) physical injury and trauma: Secondary | ICD-10-CM | POA: Diagnosis not present

## 2016-11-12 DIAGNOSIS — M545 Low back pain: Secondary | ICD-10-CM | POA: Diagnosis not present

## 2016-11-12 DIAGNOSIS — G8250 Quadriplegia, unspecified: Secondary | ICD-10-CM | POA: Diagnosis not present

## 2016-11-12 DIAGNOSIS — Z87312 Personal history of (healed) stress fracture: Secondary | ICD-10-CM | POA: Diagnosis not present

## 2016-11-12 DIAGNOSIS — Z87828 Personal history of other (healed) physical injury and trauma: Secondary | ICD-10-CM | POA: Diagnosis not present

## 2016-11-12 DIAGNOSIS — M6281 Muscle weakness (generalized): Secondary | ICD-10-CM | POA: Diagnosis not present

## 2016-11-12 DIAGNOSIS — Z86711 Personal history of pulmonary embolism: Secondary | ICD-10-CM | POA: Diagnosis not present

## 2016-11-15 DIAGNOSIS — Z87312 Personal history of (healed) stress fracture: Secondary | ICD-10-CM | POA: Diagnosis not present

## 2016-11-15 DIAGNOSIS — M545 Low back pain: Secondary | ICD-10-CM | POA: Diagnosis not present

## 2016-11-15 DIAGNOSIS — Z87828 Personal history of other (healed) physical injury and trauma: Secondary | ICD-10-CM | POA: Diagnosis not present

## 2016-11-15 DIAGNOSIS — Z86711 Personal history of pulmonary embolism: Secondary | ICD-10-CM | POA: Diagnosis not present

## 2016-11-15 DIAGNOSIS — M6281 Muscle weakness (generalized): Secondary | ICD-10-CM | POA: Diagnosis not present

## 2016-11-15 DIAGNOSIS — G8250 Quadriplegia, unspecified: Secondary | ICD-10-CM | POA: Diagnosis not present

## 2016-11-17 DIAGNOSIS — M6281 Muscle weakness (generalized): Secondary | ICD-10-CM | POA: Diagnosis not present

## 2016-11-17 DIAGNOSIS — Z86711 Personal history of pulmonary embolism: Secondary | ICD-10-CM | POA: Diagnosis not present

## 2016-11-17 DIAGNOSIS — G8250 Quadriplegia, unspecified: Secondary | ICD-10-CM | POA: Diagnosis not present

## 2016-11-17 DIAGNOSIS — Z87312 Personal history of (healed) stress fracture: Secondary | ICD-10-CM | POA: Diagnosis not present

## 2016-11-17 DIAGNOSIS — M545 Low back pain: Secondary | ICD-10-CM | POA: Diagnosis not present

## 2016-11-17 DIAGNOSIS — Z87828 Personal history of other (healed) physical injury and trauma: Secondary | ICD-10-CM | POA: Diagnosis not present

## 2016-11-22 DIAGNOSIS — M545 Low back pain: Secondary | ICD-10-CM | POA: Diagnosis not present

## 2016-11-22 DIAGNOSIS — Z86711 Personal history of pulmonary embolism: Secondary | ICD-10-CM | POA: Diagnosis not present

## 2016-11-22 DIAGNOSIS — M6281 Muscle weakness (generalized): Secondary | ICD-10-CM | POA: Diagnosis not present

## 2016-11-22 DIAGNOSIS — Z87828 Personal history of other (healed) physical injury and trauma: Secondary | ICD-10-CM | POA: Diagnosis not present

## 2016-11-22 DIAGNOSIS — Z87312 Personal history of (healed) stress fracture: Secondary | ICD-10-CM | POA: Diagnosis not present

## 2016-11-22 DIAGNOSIS — G8250 Quadriplegia, unspecified: Secondary | ICD-10-CM | POA: Diagnosis not present

## 2016-11-24 DIAGNOSIS — G8250 Quadriplegia, unspecified: Secondary | ICD-10-CM | POA: Diagnosis not present

## 2016-11-24 DIAGNOSIS — M545 Low back pain: Secondary | ICD-10-CM | POA: Diagnosis not present

## 2016-11-24 DIAGNOSIS — M6281 Muscle weakness (generalized): Secondary | ICD-10-CM | POA: Diagnosis not present

## 2016-11-24 DIAGNOSIS — Z86711 Personal history of pulmonary embolism: Secondary | ICD-10-CM | POA: Diagnosis not present

## 2016-11-24 DIAGNOSIS — Z87828 Personal history of other (healed) physical injury and trauma: Secondary | ICD-10-CM | POA: Diagnosis not present

## 2016-11-24 DIAGNOSIS — Z87312 Personal history of (healed) stress fracture: Secondary | ICD-10-CM | POA: Diagnosis not present

## 2016-11-29 DIAGNOSIS — M545 Low back pain: Secondary | ICD-10-CM | POA: Diagnosis not present

## 2016-11-29 DIAGNOSIS — M6281 Muscle weakness (generalized): Secondary | ICD-10-CM | POA: Diagnosis not present

## 2016-11-29 DIAGNOSIS — Z86711 Personal history of pulmonary embolism: Secondary | ICD-10-CM | POA: Diagnosis not present

## 2016-11-29 DIAGNOSIS — Z87312 Personal history of (healed) stress fracture: Secondary | ICD-10-CM | POA: Diagnosis not present

## 2016-11-29 DIAGNOSIS — G8250 Quadriplegia, unspecified: Secondary | ICD-10-CM | POA: Diagnosis not present

## 2016-11-29 DIAGNOSIS — Z87828 Personal history of other (healed) physical injury and trauma: Secondary | ICD-10-CM | POA: Diagnosis not present

## 2016-12-01 DIAGNOSIS — Z86711 Personal history of pulmonary embolism: Secondary | ICD-10-CM | POA: Diagnosis not present

## 2016-12-01 DIAGNOSIS — M6281 Muscle weakness (generalized): Secondary | ICD-10-CM | POA: Diagnosis not present

## 2016-12-01 DIAGNOSIS — M545 Low back pain: Secondary | ICD-10-CM | POA: Diagnosis not present

## 2016-12-01 DIAGNOSIS — Z87312 Personal history of (healed) stress fracture: Secondary | ICD-10-CM | POA: Diagnosis not present

## 2016-12-01 DIAGNOSIS — Z87828 Personal history of other (healed) physical injury and trauma: Secondary | ICD-10-CM | POA: Diagnosis not present

## 2016-12-01 DIAGNOSIS — G8250 Quadriplegia, unspecified: Secondary | ICD-10-CM | POA: Diagnosis not present

## 2016-12-06 DIAGNOSIS — Z87828 Personal history of other (healed) physical injury and trauma: Secondary | ICD-10-CM | POA: Diagnosis not present

## 2016-12-06 DIAGNOSIS — G8250 Quadriplegia, unspecified: Secondary | ICD-10-CM | POA: Diagnosis not present

## 2016-12-06 DIAGNOSIS — M6281 Muscle weakness (generalized): Secondary | ICD-10-CM | POA: Diagnosis not present

## 2016-12-06 DIAGNOSIS — Z86711 Personal history of pulmonary embolism: Secondary | ICD-10-CM | POA: Diagnosis not present

## 2016-12-06 DIAGNOSIS — Z87312 Personal history of (healed) stress fracture: Secondary | ICD-10-CM | POA: Diagnosis not present

## 2016-12-06 DIAGNOSIS — M545 Low back pain: Secondary | ICD-10-CM | POA: Diagnosis not present

## 2016-12-08 DIAGNOSIS — Z87828 Personal history of other (healed) physical injury and trauma: Secondary | ICD-10-CM | POA: Diagnosis not present

## 2016-12-08 DIAGNOSIS — G8250 Quadriplegia, unspecified: Secondary | ICD-10-CM | POA: Diagnosis not present

## 2016-12-08 DIAGNOSIS — M545 Low back pain: Secondary | ICD-10-CM | POA: Diagnosis not present

## 2016-12-08 DIAGNOSIS — Z86711 Personal history of pulmonary embolism: Secondary | ICD-10-CM | POA: Diagnosis not present

## 2016-12-08 DIAGNOSIS — M6281 Muscle weakness (generalized): Secondary | ICD-10-CM | POA: Diagnosis not present

## 2016-12-08 DIAGNOSIS — Z87312 Personal history of (healed) stress fracture: Secondary | ICD-10-CM | POA: Diagnosis not present

## 2016-12-15 DIAGNOSIS — Z87312 Personal history of (healed) stress fracture: Secondary | ICD-10-CM | POA: Diagnosis not present

## 2016-12-15 DIAGNOSIS — M6281 Muscle weakness (generalized): Secondary | ICD-10-CM | POA: Diagnosis not present

## 2016-12-15 DIAGNOSIS — M545 Low back pain: Secondary | ICD-10-CM | POA: Diagnosis not present

## 2016-12-15 DIAGNOSIS — G8250 Quadriplegia, unspecified: Secondary | ICD-10-CM | POA: Diagnosis not present

## 2016-12-15 DIAGNOSIS — Z87828 Personal history of other (healed) physical injury and trauma: Secondary | ICD-10-CM | POA: Diagnosis not present

## 2016-12-15 DIAGNOSIS — Z86711 Personal history of pulmonary embolism: Secondary | ICD-10-CM | POA: Diagnosis not present

## 2016-12-17 DIAGNOSIS — M6281 Muscle weakness (generalized): Secondary | ICD-10-CM | POA: Diagnosis not present

## 2016-12-17 DIAGNOSIS — Z86711 Personal history of pulmonary embolism: Secondary | ICD-10-CM | POA: Diagnosis not present

## 2016-12-17 DIAGNOSIS — Z87312 Personal history of (healed) stress fracture: Secondary | ICD-10-CM | POA: Diagnosis not present

## 2016-12-17 DIAGNOSIS — G8250 Quadriplegia, unspecified: Secondary | ICD-10-CM | POA: Diagnosis not present

## 2016-12-17 DIAGNOSIS — M545 Low back pain: Secondary | ICD-10-CM | POA: Diagnosis not present

## 2016-12-17 DIAGNOSIS — Z87828 Personal history of other (healed) physical injury and trauma: Secondary | ICD-10-CM | POA: Diagnosis not present

## 2017-04-11 ENCOUNTER — Encounter (HOSPITAL_COMMUNITY): Payer: Self-pay | Admitting: Pharmacy Technician

## 2017-04-11 ENCOUNTER — Emergency Department (HOSPITAL_COMMUNITY): Payer: Medicare Other

## 2017-04-11 ENCOUNTER — Emergency Department (HOSPITAL_COMMUNITY)
Admission: EM | Admit: 2017-04-11 | Discharge: 2017-04-11 | Disposition: A | Discharge status: Home / Self Care | Payer: Medicare Other | Attending: Emergency Medicine | Admitting: Emergency Medicine

## 2017-04-11 DIAGNOSIS — I10 Essential (primary) hypertension: Secondary | ICD-10-CM | POA: Insufficient documentation

## 2017-04-11 DIAGNOSIS — Z7901 Long term (current) use of anticoagulants: Secondary | ICD-10-CM | POA: Diagnosis not present

## 2017-04-11 DIAGNOSIS — R002 Palpitations: Secondary | ICD-10-CM

## 2017-04-11 DIAGNOSIS — I491 Atrial premature depolarization: Secondary | ICD-10-CM | POA: Insufficient documentation

## 2017-04-11 DIAGNOSIS — Z79899 Other long term (current) drug therapy: Secondary | ICD-10-CM | POA: Insufficient documentation

## 2017-04-11 DIAGNOSIS — R079 Chest pain, unspecified: Secondary | ICD-10-CM | POA: Diagnosis not present

## 2017-04-11 LAB — CBC WITH DIFFERENTIAL/PLATELET
Basophils Absolute: 0 10*3/uL (ref 0.0–0.1)
Basophils Relative: 0 %
EOS ABS: 0.2 10*3/uL (ref 0.0–0.7)
Eosinophils Relative: 2 %
HEMATOCRIT: 40.6 % (ref 39.0–52.0)
HEMOGLOBIN: 13.1 g/dL (ref 13.0–17.0)
LYMPHS ABS: 2.4 10*3/uL (ref 0.7–4.0)
Lymphocytes Relative: 19 %
MCH: 28.7 pg (ref 26.0–34.0)
MCHC: 32.3 g/dL (ref 30.0–36.0)
MCV: 88.8 fL (ref 78.0–100.0)
Monocytes Absolute: 0.7 10*3/uL (ref 0.1–1.0)
Monocytes Relative: 5 %
NEUTROS ABS: 9.4 10*3/uL — AB (ref 1.7–7.7)
NEUTROS PCT: 74 %
Platelets: 232 10*3/uL (ref 150–400)
RBC: 4.57 MIL/uL (ref 4.22–5.81)
RDW: 14.9 % (ref 11.5–15.5)
WBC: 12.7 10*3/uL — AB (ref 4.0–10.5)

## 2017-04-11 LAB — I-STAT TROPONIN, ED: Troponin i, poc: 0 ng/mL (ref 0.00–0.08)

## 2017-04-11 LAB — BASIC METABOLIC PANEL
ANION GAP: 11 (ref 5–15)
BUN: 12 mg/dL (ref 6–20)
CALCIUM: 8.8 mg/dL — AB (ref 8.9–10.3)
CO2: 22 mmol/L (ref 22–32)
Chloride: 103 mmol/L (ref 101–111)
Creatinine, Ser: 0.73 mg/dL (ref 0.61–1.24)
Glucose, Bld: 130 mg/dL — ABNORMAL HIGH (ref 65–99)
POTASSIUM: 3.9 mmol/L (ref 3.5–5.1)
Sodium: 136 mmol/L (ref 135–145)

## 2017-04-11 LAB — D-DIMER, QUANTITATIVE (NOT AT ARMC): D DIMER QUANT: 0.36 ug{FEU}/mL (ref 0.00–0.50)

## 2017-04-11 NOTE — Discharge Instructions (Signed)
No signs of atrial fibrillation or PE/DVT on todays work up. There were some PACs noted on the monitor, see attached print out. These are not dangerous. Please follow up with family doctor as needed. Return if worsening symptoms.

## 2017-04-11 NOTE — ED Triage Notes (Signed)
Pt arrives via [facility] from home with reports of palpitations onset today upon awakening. Pt with hx of DVT and states this is the same feeling as previous DVT's. Pt is on xarelto. Pt is a paraplegic. EKG unremarkable with EMS. BP 114/68, HR 58, 98% RA. Pt denies any leg pain.

## 2017-04-11 NOTE — ED Notes (Signed)
[facility] called for transport.  

## 2017-04-11 NOTE — ED Notes (Signed)
Family at bedside. 
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
## 2017-12-05 ENCOUNTER — Emergency Department (HOSPITAL_COMMUNITY): Payer: Medicare Other

## 2017-12-05 ENCOUNTER — Other Ambulatory Visit: Payer: Self-pay

## 2017-12-05 ENCOUNTER — Emergency Department (HOSPITAL_COMMUNITY)
Admission: EM | Admit: 2017-12-05 | Discharge: 2017-12-06 | Disposition: A | Discharge status: Home / Self Care | Payer: Medicare Other | Attending: Emergency Medicine | Admitting: Emergency Medicine

## 2017-12-05 DIAGNOSIS — Z79899 Other long term (current) drug therapy: Secondary | ICD-10-CM | POA: Diagnosis not present

## 2017-12-05 DIAGNOSIS — S82821A Torus fracture of lower end of right fibula, initial encounter for closed fracture: Secondary | ICD-10-CM | POA: Insufficient documentation

## 2017-12-05 DIAGNOSIS — S82311A Torus fracture of lower end of right tibia, initial encounter for closed fracture: Secondary | ICD-10-CM | POA: Insufficient documentation

## 2017-12-05 DIAGNOSIS — S82161A Torus fracture of upper end of right tibia, initial encounter for closed fracture: Secondary | ICD-10-CM

## 2017-12-05 DIAGNOSIS — Y999 Unspecified external cause status: Secondary | ICD-10-CM | POA: Diagnosis not present

## 2017-12-05 DIAGNOSIS — Y939 Activity, unspecified: Secondary | ICD-10-CM | POA: Diagnosis not present

## 2017-12-05 DIAGNOSIS — W19XXXA Unspecified fall, initial encounter: Secondary | ICD-10-CM | POA: Insufficient documentation

## 2017-12-05 DIAGNOSIS — Y92009 Unspecified place in unspecified non-institutional (private) residence as the place of occurrence of the external cause: Secondary | ICD-10-CM | POA: Insufficient documentation

## 2017-12-05 DIAGNOSIS — S82811A Torus fracture of upper end of right fibula, initial encounter for closed fracture: Secondary | ICD-10-CM

## 2017-12-05 MED ORDER — MORPHINE SULFATE (PF) 4 MG/ML IV SOLN
4.0000 mg | INTRAVENOUS | Status: DC | PRN
  Administered 2017-12-06: 4 mg via INTRAVENOUS
  Filled 2017-12-05: qty 1

## 2017-12-05 NOTE — ED Notes (Signed)
Delay in lab draw,  Pt not in room at this time. 
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
## 2017-12-06 ENCOUNTER — Emergency Department (HOSPITAL_COMMUNITY): Payer: Medicare Other

## 2017-12-06 LAB — CBC WITH DIFFERENTIAL/PLATELET
Abs Immature Granulocytes: 0.1 10*3/uL — ABNORMAL HIGH (ref 0.00–0.07)
BASOS ABS: 0 10*3/uL (ref 0.0–0.1)
Basophils Relative: 0 %
Eosinophils Absolute: 0.3 10*3/uL (ref 0.0–0.5)
Eosinophils Relative: 2 %
HCT: 37.5 % — ABNORMAL LOW (ref 39.0–52.0)
HEMOGLOBIN: 11.5 g/dL — AB (ref 13.0–17.0)
IMMATURE GRANULOCYTES: 1 %
LYMPHS PCT: 11 %
Lymphs Abs: 1.6 10*3/uL (ref 0.7–4.0)
MCH: 27.4 pg (ref 26.0–34.0)
MCHC: 30.7 g/dL (ref 30.0–36.0)
MCV: 89.5 fL (ref 80.0–100.0)
Monocytes Absolute: 0.7 10*3/uL (ref 0.1–1.0)
Monocytes Relative: 4 %
NEUTROS ABS: 12.6 10*3/uL — AB (ref 1.7–7.7)
NEUTROS PCT: 82 %
Platelets: 281 10*3/uL (ref 150–400)
RBC: 4.19 MIL/uL — AB (ref 4.22–5.81)
RDW: 14.9 % (ref 11.5–15.5)
WBC: 15.3 10*3/uL — AB (ref 4.0–10.5)
nRBC: 0 % (ref 0.0–0.2)

## 2017-12-06 LAB — BASIC METABOLIC PANEL
Anion gap: 8 (ref 5–15)
BUN: 10 mg/dL (ref 8–23)
CALCIUM: 8.7 mg/dL — AB (ref 8.9–10.3)
CHLORIDE: 108 mmol/L (ref 98–111)
CO2: 23 mmol/L (ref 22–32)
CREATININE: 0.73 mg/dL (ref 0.61–1.24)
GFR calc Af Amer: >60 mL/min (ref >60)
GFR calc non Af Amer: >60 mL/min (ref >60)
GLUCOSE: 110 mg/dL — AB (ref 70–99)
Potassium: 3.9 mmol/L (ref 3.5–5.1)
Sodium: 139 mmol/L (ref 135–145)

## 2017-12-06 LAB — ABO/RH: ABO/RH(D): A POS

## 2017-12-06 LAB — PROTIME-INR
INR: 1.01
PROTHROMBIN TIME: 13.3 s (ref 11.4–15.2)

## 2017-12-06 LAB — TYPE AND SCREEN
ABO/RH(D): A POS
Antibody Screen: NEGATIVE

## 2017-12-06 MED ORDER — SODIUM CHLORIDE 0.9 % IV SOLN: 1.0000 g | Freq: Once | INTRAVENOUS | Status: DC

## 2017-12-06 MED ORDER — MORPHINE SULFATE (PF) 4 MG/ML IV SOLN
4.0000 mg | Freq: Once | INTRAVENOUS | Status: AC
  Administered 2017-12-06: 4 mg via INTRAVENOUS
  Filled 2017-12-06: qty 1

## 2017-12-06 MED ORDER — HYDROCODONE-ACETAMINOPHEN 5-325 MG PO TABS
1.0000 | ORAL_TABLET | Freq: Once | ORAL | Status: AC
  Administered 2017-12-06: 1 via ORAL
  Filled 2017-12-06: qty 1

## 2017-12-06 MED ORDER — CEPHALEXIN 250 MG PO CAPS
500.0000 mg | ORAL_CAPSULE | Freq: Once | ORAL | Status: AC
  Administered 2017-12-06: 500 mg via ORAL
  Filled 2017-12-06: qty 2

## 2017-12-06 MED ORDER — ACETAMINOPHEN 500 MG PO TABS
1000.0000 mg | ORAL_TABLET | Freq: Three times a day (TID) | ORAL | 0 refills | Status: AC
Start: 2017-12-06 — End: 2017-12-11

## 2017-12-06 MED ORDER — OXYCODONE HCL 5 MG PO TABS: 5.0000 mg | ORAL_TABLET | Freq: Four times a day (QID) | ORAL | 0 refills | Status: AC | PRN

## 2017-12-06 NOTE — ED Notes (Signed)
Nurse collecting labs. 

## 2017-12-06 NOTE — ED Notes (Signed)
[facility] blood draw. 
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
## 2017-12-06 NOTE — ED Notes (Signed)
Pt departed in NAD.  

## 2017-12-06 NOTE — Progress Notes (Signed)
Orthopedic Tech Progress Note Patient Details:  Lucas Harper 1955-12-28 [id number]  Ortho Devices Type of Ortho Device: Post (long leg) splint Ortho Device/Splint Location: rle. Applied at drs request with his assistance. Ortho Device/Splint Interventions: Ordered, Application, Adjustment   Post Interventions Patient Tolerated: Well Instructions Provided: Care of device, Adjustment of device   [name] 12/06/2017, 5:54 AM

## 2017-12-06 NOTE — ED Notes (Signed)
[facility] called for transport.  

## 2017-12-06 NOTE — Discharge Instructions (Addendum)
Attempts pain control with scheduled Tylenol and Suboxone.  If this is not provide sufficient pain control, discontinue Suboxone use and began taking the oxycodone as directed.

## 2017-12-07 ENCOUNTER — Telehealth (INDEPENDENT_AMBULATORY_CARE_PROVIDER_SITE_OTHER): Payer: Self-pay | Admitting: Orthopedic Surgery
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
## 2017-12-08 NOTE — Telephone Encounter (Signed)
IC s/w patient daughter per Dr August Saucer moved appt to Friday a.m.

## 2017-12-09 ENCOUNTER — Encounter (INDEPENDENT_AMBULATORY_CARE_PROVIDER_SITE_OTHER): Payer: Self-pay | Admitting: Orthopedic Surgery

## 2017-12-09 ENCOUNTER — Ambulatory Visit (INDEPENDENT_AMBULATORY_CARE_PROVIDER_SITE_OTHER): Payer: Medicare Other | Admitting: Orthopedic Surgery

## 2017-12-09 DIAGNOSIS — M25561 Pain in right knee: Secondary | ICD-10-CM | POA: Diagnosis not present
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
## 2017-12-14 ENCOUNTER — Telehealth (INDEPENDENT_AMBULATORY_CARE_PROVIDER_SITE_OTHER): Payer: Self-pay

## 2017-12-14 NOTE — Telephone Encounter (Signed)
Not really

## 2017-12-14 NOTE — Telephone Encounter (Signed)
Patients daughter Ava called wanting to know if you could recommend a brace that they could use for her dad to assist with transfers? Please advise. Thanks. [phone]

## 2017-12-14 NOTE — Telephone Encounter (Signed)
IC s/w her and advised.  

## 2017-12-15 ENCOUNTER — Ambulatory Visit (INDEPENDENT_AMBULATORY_CARE_PROVIDER_SITE_OTHER): Payer: Self-pay | Admitting: Orthopedic Surgery

## 2018-03-09 IMAGING — CT CT ABD-PELV W/ CM
2 of 5 series · 15 of 46 positions shown, 17 images · IV contrast (ISOVUE)
Comparison: 11/24/2010

CLINICAL DATA: Upper abdominal pain, hypotension

EXAM:
CT ABDOMEN AND PELVIS WITH CONTRAST
TECHNIQUE: Multidetector CT imaging of the abdomen and pelvis was performed
using the standard protocol following bolus administration of
intravenous contrast.
CONTRAST:  100mL 09NVYP-J88 IOPAMIDOL (09NVYP-J88) INJECTION 61%

[Series 2: abd/pel with · axial · 0.87mm/px · z∈[+788,+1214]mm · 12 of 99 slices shown, 14 images]
[im 7/99  soft-tissue]
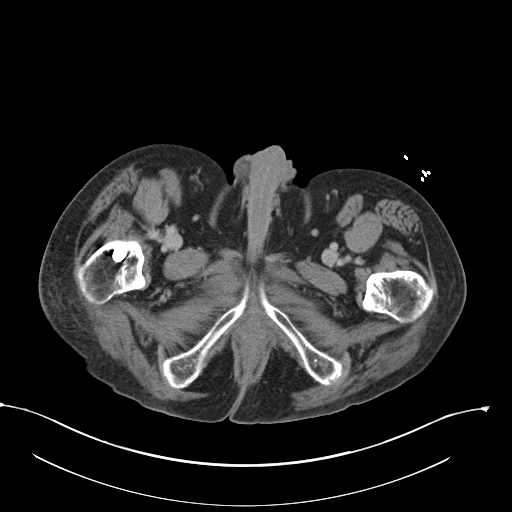
[im 7/99  bone]
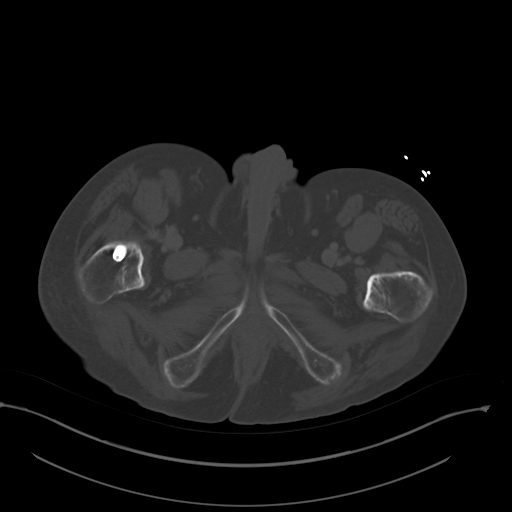
[im 14/99  soft-tissue]
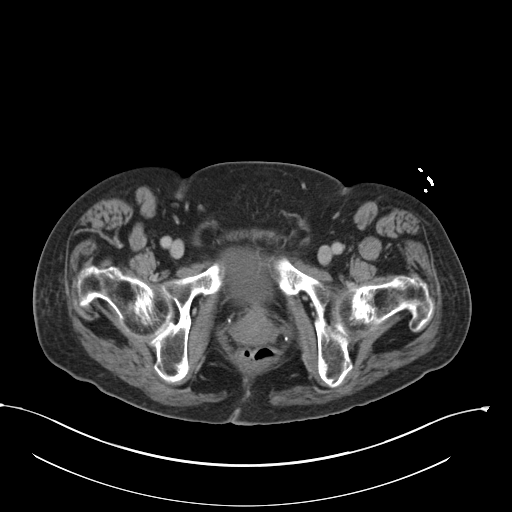
[im 20/99  soft-tissue]
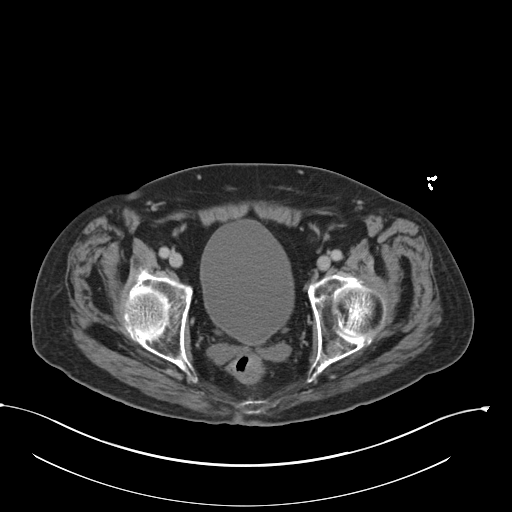
[im 33/99  soft-tissue]
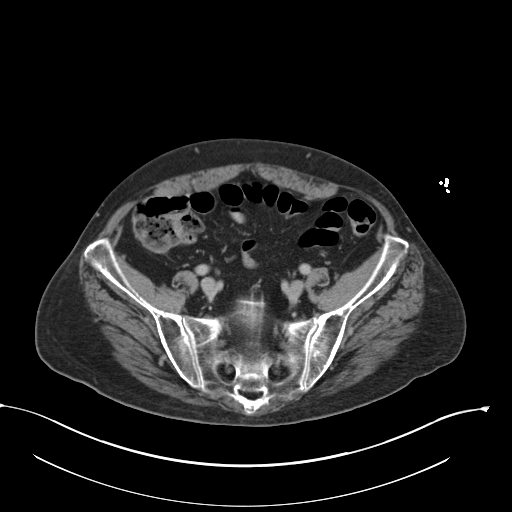
[im 40/99  soft-tissue]
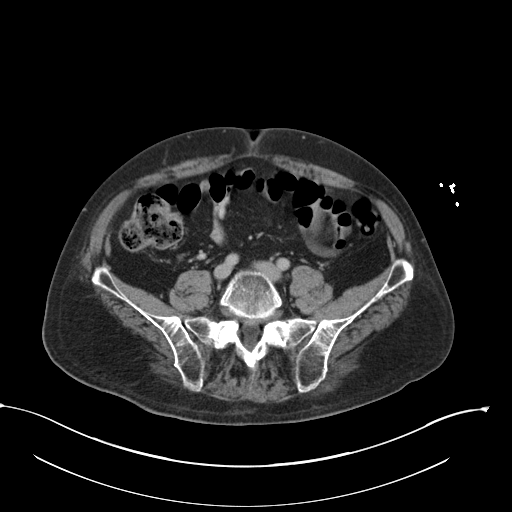
[im 46/99  soft-tissue]
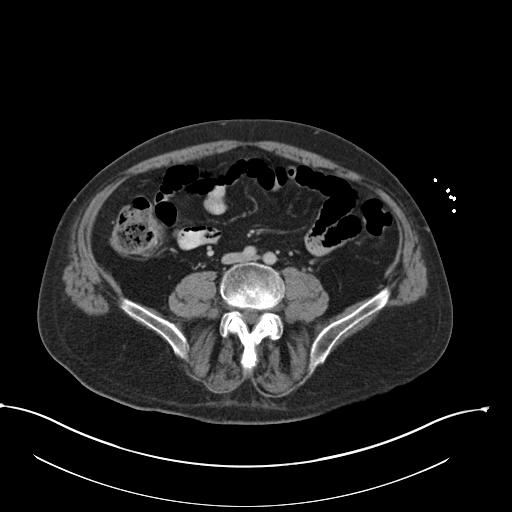
[im 53/99  soft-tissue]
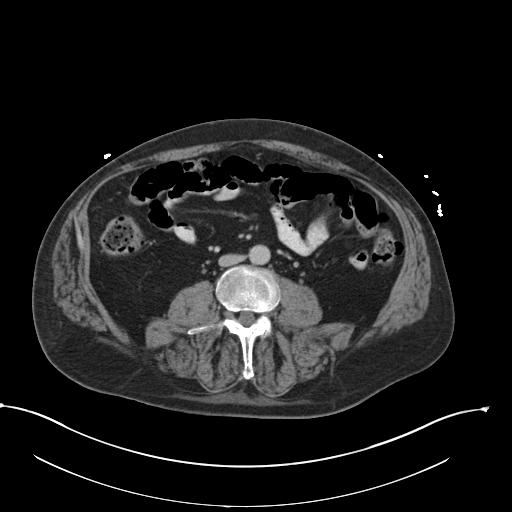
[im 59/99  soft-tissue]
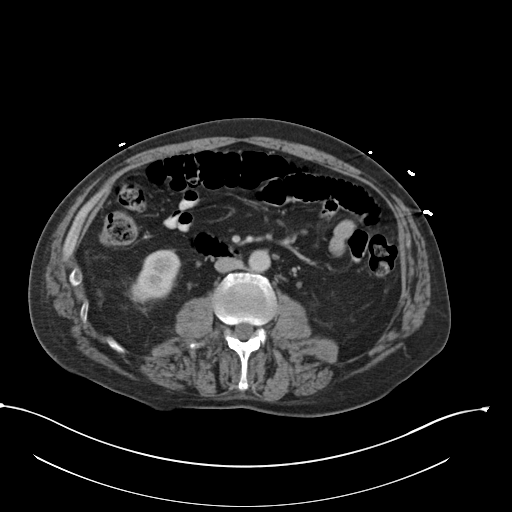
[im 66/99  soft-tissue]
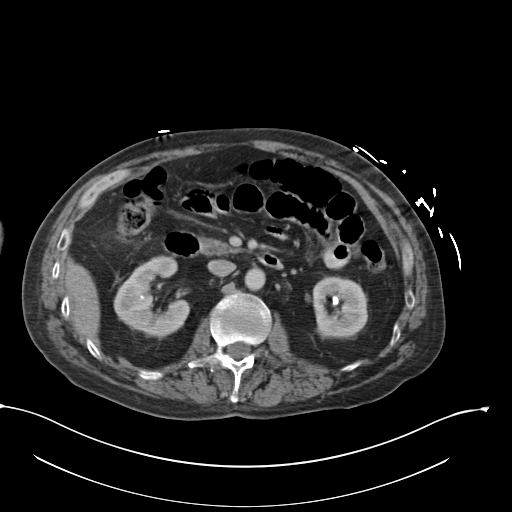
[im 66/99  bone]
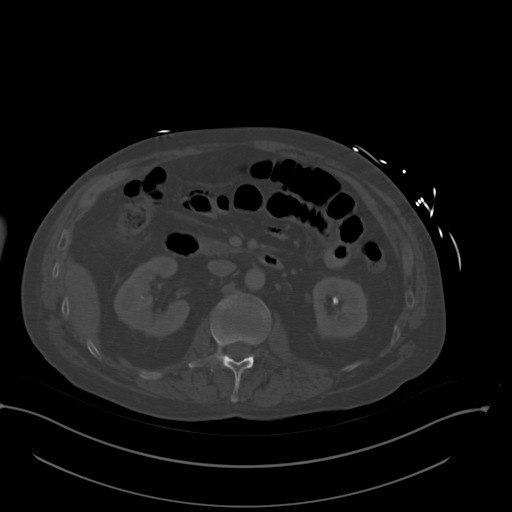
[im 79/99  soft-tissue]
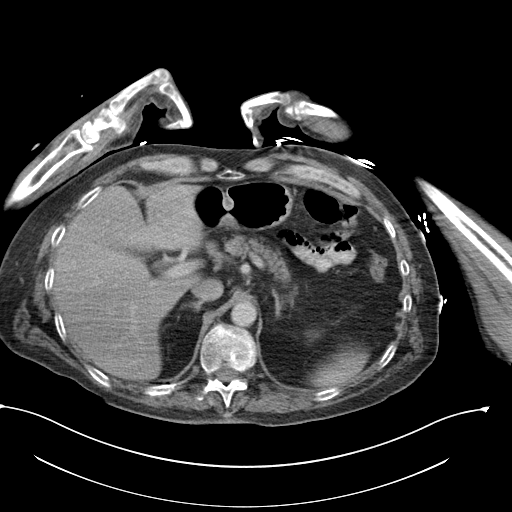
[im 85/99  soft-tissue]
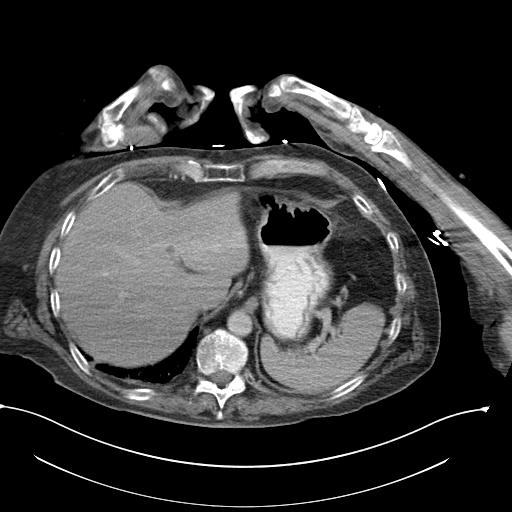
[im 92/99  soft-tissue]
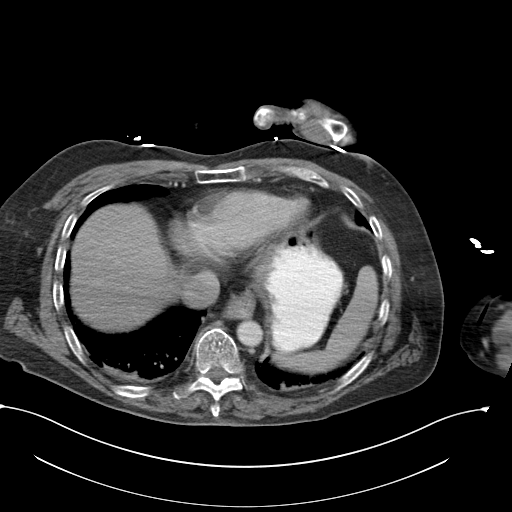

[Series 3: coronal a/|p · coronal · 0.78mm/px · 3 of 143 slices shown]
[im 48/143  soft-tissue]
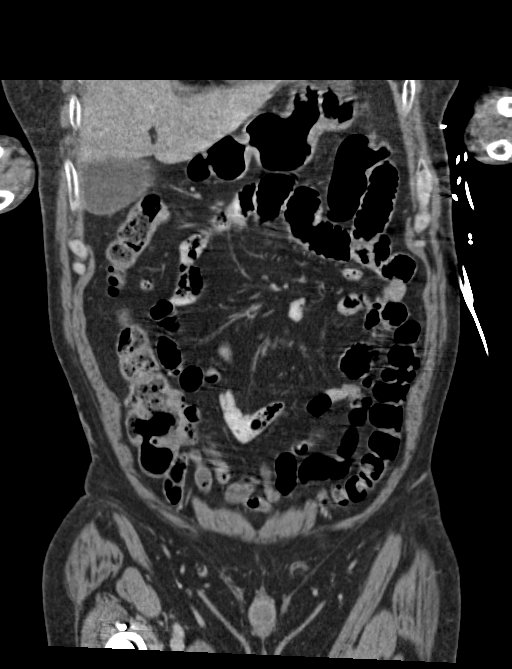
[im 64/143  soft-tissue]
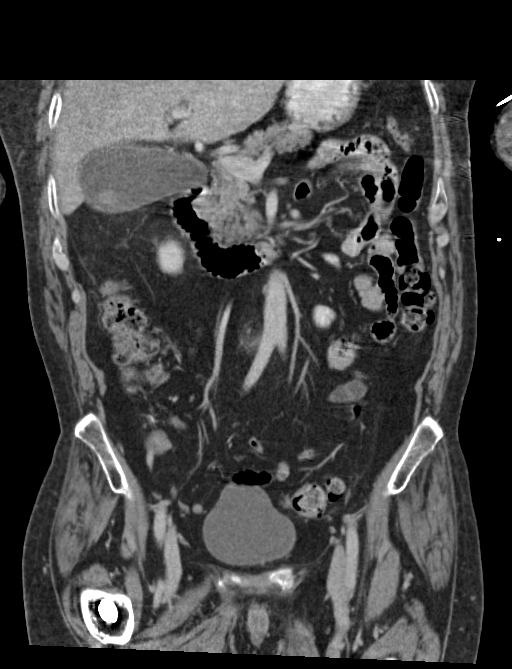
[im 79/143  soft-tissue]
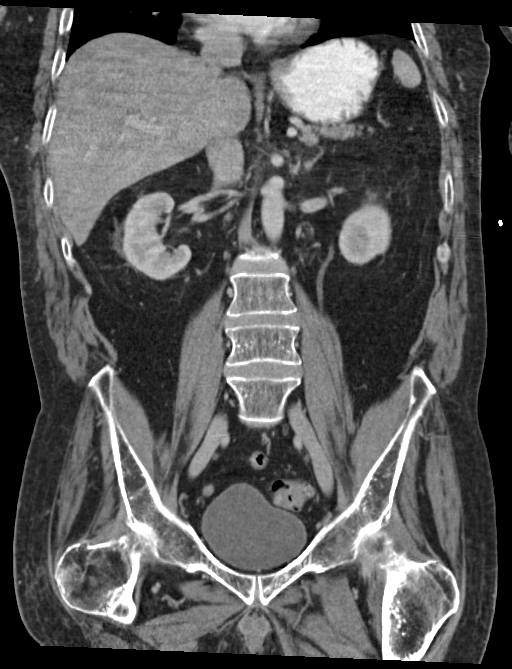

[15 of 46 positions shown; findings below may reference images not displayed]

FINDINGS: Lower chest:  Lung bases are unremarkable.

Hepatobiliary: Unenhanced liver shows no biliary ductal dilatation.
There is distended gallbladder. There is thickening of gallbladder
wall up to 6 mm. Gallstones are noted within gallbladder the largest
measures 1.1 cm. Cholecystitis cannot be excluded. Clinical
correlation is necessary. Further correlation with gallbladder
ultrasound is recommended.

Pancreas: Enhanced pancreas is unremarkable.

Spleen: Enhanced spleen is unremarkable.

Adrenals/Urinary Tract: No adrenal gland mass. Kidneys are
symmetrical in size and enhancement. No hydronephrosis or
hydroureter. At least 3 nonobstructive calcified calculi are noted
within right kidney the largest in midpole measures 3.5 mm.
Calcified nonobstructive calculus in lower pole of the left kidney
measures 7.5 mm.

Delayed renal images shows bilateral renal symmetrical excretion.
Bilateral visualized proximal ureter is unremarkable. Moderate
distended urinary bladder. No urinary bladder filling defects.

Stomach/Bowel: No small bowel obstruction. No gastric outlet
obstruction. There is some intraluminal small bowel gas without
significant small bowel distension. No pericecal inflammation. The
terminal ileum is unremarkable. Normal appendix is noted in axial
image 75. Moderate gas noted within transverse colon. Some colonic
stool and gas noted within descending colon. Moderate stool noted
within sigmoid colon. No colitis or diverticulitis.

Vascular/Lymphatic: No aortic aneurysm. No retroperitoneal or
mesenteric adenopathy.

Reproductive: Prostate gland and seminal vesicles are unremarkable.
No pelvic mass is noted.

Other: There is no evidence of ascites or free abdominal air.

Musculoskeletal: Sagittal images of the spine shows osteopenia and
mild degenerative changes lower thoracic spine. No destructive bony
lesions are noted.
IMPRESSION: 1. There is a distended gallbladder. Thickening and mild enhancement
gallbladder wall. Gallstones are noted within gallbladder. Acute
cholecystitis cannot be excluded. Clinical correlation is necessary.
Further correlation with gallbladder ultrasound is recommended.
2. There is bilateral nonobstructive nephrolithiasis. No
hydronephrosis or hydroureter.
3. Diffuse minimal gaseous distension small bowel loops without
small bowel obstruction. No thickening of small bowel wall.
4. Colonic stool and gas as described above. No evidence of colitis
or diverticulitis. No pericecal inflammation. Normal appendix.
